# Patient Record
Sex: Male | Born: 1935 | Race: White | Hispanic: No | Marital: Married | Smoking: Former smoker
Health system: Southern US, Community
[De-identification: ages and names within clinical notes are randomized; demographics above are authoritative.]

## PROBLEM LIST (undated history)

## (undated) DIAGNOSIS — M4802 Spinal stenosis, cervical region: Secondary | ICD-10-CM

## (undated) DIAGNOSIS — W57XXXA Bitten or stung by nonvenomous insect and other nonvenomous arthropods, initial encounter: Secondary | ICD-10-CM

## (undated) DIAGNOSIS — R066 Hiccough: Secondary | ICD-10-CM

## (undated) DIAGNOSIS — E876 Hypokalemia: Secondary | ICD-10-CM

## (undated) DIAGNOSIS — E559 Vitamin D deficiency, unspecified: Secondary | ICD-10-CM

## (undated) DIAGNOSIS — M25512 Pain in left shoulder: Secondary | ICD-10-CM

## (undated) DIAGNOSIS — Z860100 Personal history of colon polyps, unspecified: Secondary | ICD-10-CM

## (undated) DIAGNOSIS — R5383 Other fatigue: Secondary | ICD-10-CM

## (undated) DIAGNOSIS — E782 Mixed hyperlipidemia: Secondary | ICD-10-CM

## (undated) DIAGNOSIS — R5381 Other malaise: Secondary | ICD-10-CM

## (undated) DIAGNOSIS — R1312 Dysphagia, oropharyngeal phase: Secondary | ICD-10-CM

## (undated) DIAGNOSIS — Z7409 Other reduced mobility: Secondary | ICD-10-CM

## (undated) DIAGNOSIS — J40 Bronchitis, not specified as acute or chronic: Secondary | ICD-10-CM

## (undated) DIAGNOSIS — B356 Tinea cruris: Secondary | ICD-10-CM

## (undated) DIAGNOSIS — E119 Type 2 diabetes mellitus without complications: Secondary | ICD-10-CM

## (undated) DIAGNOSIS — M25561 Pain in right knee: Secondary | ICD-10-CM

## (undated) DIAGNOSIS — N183 Chronic kidney disease, stage 3 (moderate): Secondary | ICD-10-CM

## (undated) DIAGNOSIS — S1093XA Contusion of unspecified part of neck, initial encounter: Secondary | ICD-10-CM

## (undated) DIAGNOSIS — E785 Hyperlipidemia, unspecified: Secondary | ICD-10-CM

## (undated) DIAGNOSIS — G8929 Other chronic pain: Secondary | ICD-10-CM

## (undated) DIAGNOSIS — K219 Gastro-esophageal reflux disease without esophagitis: Secondary | ICD-10-CM

## (undated) DIAGNOSIS — I10 Essential (primary) hypertension: Secondary | ICD-10-CM

## (undated) DIAGNOSIS — R05 Cough: Secondary | ICD-10-CM

## (undated) DIAGNOSIS — Z981 Arthrodesis status: Secondary | ICD-10-CM

## (undated) DIAGNOSIS — R7989 Other specified abnormal findings of blood chemistry: Secondary | ICD-10-CM

## (undated) DIAGNOSIS — I4891 Unspecified atrial fibrillation: Secondary | ICD-10-CM

## (undated) DIAGNOSIS — I480 Paroxysmal atrial fibrillation: Secondary | ICD-10-CM

## (undated) HISTORY — DX: Cough: R05

## (undated) HISTORY — DX: Type 2 diabetes mellitus without complications: E11.9

## (undated) HISTORY — DX: Spinal stenosis, cervical region: M48.02

## (undated) HISTORY — DX: Hypokalemia: E87.6

## (undated) HISTORY — DX: Other malaise: R53.81

## (undated) HISTORY — DX: Pain in left shoulder: M25.512

## (undated) HISTORY — DX: Personal history of colonic polyps: Z86.010

## (undated) HISTORY — PX: CARPAL TUNNEL RELEASE: SHX101

## (undated) HISTORY — PX: ESOPHAGOGASTRECTOMY: SHX1528

## (undated) HISTORY — DX: Gastro-esophageal reflux disease without esophagitis: K21.9

## (undated) HISTORY — DX: Mixed hyperlipidemia: E78.2

## (undated) HISTORY — DX: Bitten or stung by nonvenomous insect and other nonvenomous arthropods, initial encounter: W57.XXXA

## (undated) HISTORY — DX: Vitamin D deficiency, unspecified: E55.9

## (undated) HISTORY — PX: CERVICAL SPINE SURGERY: SHX589

## (undated) HISTORY — DX: Pain in right knee: M25.561

## (undated) HISTORY — DX: Hiccough: R06.6

## (undated) HISTORY — DX: Paroxysmal atrial fibrillation: I48.0

## (undated) HISTORY — DX: Bronchitis, not specified as acute or chronic: J40

## (undated) HISTORY — DX: Personal history of colon polyps, unspecified: Z86.0100

## (undated) HISTORY — DX: Essential (primary) hypertension: I10

## (undated) HISTORY — DX: Other chronic pain: G89.29

## (undated) HISTORY — DX: Tinea cruris: B35.6

## (undated) HISTORY — DX: Arthrodesis status: Z98.1

## (undated) HISTORY — DX: Contusion of unspecified part of neck, initial encounter: S10.93XA

## (undated) HISTORY — PX: PROSTATE SURGERY: SHX751

## (undated) HISTORY — DX: Other reduced mobility: Z74.09

## (undated) HISTORY — DX: Dysphagia, oropharyngeal phase: R13.12

## (undated) HISTORY — DX: Unspecified atrial fibrillation: I48.91

## (undated) HISTORY — DX: Other specified abnormal findings of blood chemistry: R79.89

## (undated) HISTORY — DX: Chronic kidney disease, stage 3 (moderate): N18.3

## (undated) HISTORY — DX: Other fatigue: R53.83

## (undated) HISTORY — DX: Hyperlipidemia, unspecified: E78.5

---

## 2008-05-23 ENCOUNTER — Ambulatory Visit (HOSPITAL_BASED_OUTPATIENT_CLINIC_OR_DEPARTMENT_OTHER): Admission: RE | Admit: 2008-05-23 | Discharge: 2008-05-23 | Payer: Self-pay | Admitting: Orthopedic Surgery

## 2011-01-14 NOTE — Op Note (Signed)
NAMEARLES, RUMBOLD          ACCOUNT NO.:  [id number]   MEDICAL RECORD NO.:  [id number]          PATIENT TYPE:  AMB   LOCATION:  DSC                          FACILITY:  [facility]   PHYSICIAN:  Katy Fitch. Sypher, M.D. DATE OF BIRTH:  09/16/1926   DATE OF PROCEDURE:  05/23/2008  DATE OF DISCHARGE:                               OPERATIVE REPORT   PREOPERATIVE DIAGNOSES:  1. Entrapped neuropathy, median nerve, left carpal tunnel.  2. Left index finger chronic stenosing tenosynovitis.  3. Left index finger 35-degree flexion contracture.   POSTOPERATIVE DIAGNOSES:  1. Entrapped neuropathy, median nerve, left carpal tunnel.  2. Left index finger chronic stenosing tenosynovitis.  3. Left index finger 35-degree flexion contracture.   OPERATION:  1. Release of left transverse carpal ligament.  2. Release of left index finger A1 pulley and limited synovectomy of      flexor tendons.  3. Injection of left index finger proximal interphalangeal joint with      Depo-Medrol and lidocaine in an effort to help ease flexion      contracture and capsular stiffness.   OPERATING SURGEON:  Katy Fitch. Sypher, MD   ASSISTANT:  Marveen Reeks Dasnoit, PA-C   ANESTHESIA:  General by LMA.   SUPERVISING ANESTHESIOLOGIST:  Burna Forts, MD   INDICATIONS:  Emmette Katt is a 77 year old gentleman referred  through the courtesy of Dr. Sherral Hammers of [place], [place].  He has  a history of hand numbness, stiffness, and triggering.   He has failed nonoperative measures.  Electrodiagnostic studies  confirmed a significant left carpal tunnel syndrome.   Due to failure to respond to nonoperative measures, he is brought to the  operating at this time anticipating left carpal tunnel release, left  index finger A1 pulley release, and left index finger PIP joint  injection.   PROCEDURE:  Calab Sachse was brought to the operating room and  placed in supine position on the operating table.   Following anesthesia screening by Dr. Jacklynn Bue, general anesthesia by  LMA was recommended and accepted by Mr. Anding.   He was brought to room 2 of the [facility], placed in supine  position on the operating table and under Dr. Marlane Mingle direct  supervision, general anesthesia by LMA technique induced.   The left arm was prepped with Betadine soap solution and sterilely  draped.  Perioperative antibiotics were not provided due to the short  duration of the anticipated procedure.   The left arm was exsanguinated with an Esmarch bandage and arterial  tourniquet on the proximal brachium inflated to 220 mmHg.   Procedure commenced with a short incision in the line of the ring finger  in the palm.  Subcutaneous tissues were carefully divided revealing the  palmar fascia.  This split longitudinally to the common sensory branch  of the median nerve.   These were followed back to the transverse carpal ligament, which was  gently isolated from the median nerve with a Insurance risk surveyor.   The transverse carpal ligament was released along its ulnar border with  scissors extending into the distal forearm.  The volar forearm fascia  was likewise released subcutaneously.   This widely opened carpal canal.  The tenosynovium of the ulnar bursa  was quite fibrotic and opaque.  The median nerve appeared to be  compressed beneath the transverse carpal ligament.   The wound was then repaired with intradermal through a Prolene suture.   A Brunner oblique incision was then fashioned over the A1 pulley of the  left index finger.  Subcutaneous tissues were carefully divided taking  care to release the pretendinous fibers of the palmar fascia.  The A1  pulley was isolated.  It was invested with a fibrotic tenosynovium.  This was cleared with a Therapist, nutritional followed by careful release of  the A1 pulley along its radial border.  This was an effort to discourage  ulnar drift of the  finger after release of the A1 pulley.  The flexor  tendons were delivered and found to be sucked down in rather thick  fibrotic tenosynovium.  This was released with scissors.   Thereafter, free passive flexion of the finger was recovered.  There was  still a 35-degree flexion contracture of the PIP joint.  The PIP joint  capsule was then infiltrated with a 50:50 mixture of Depo-Medrol 40  mg/mL 1 mL and 1 mL of 2% lidocaine.   Total volume of 1.4 mL was delivered.  This was injected into the joint  capsule followed by range of motion, followed by gentle injection  beneath the collateral ligaments radially and ulnarly.   Mr. Grainger tolerated the surgery and anesthesia well.  His wounds  were repaired with intradermal through a Prolene as described followed  by dressing with sterile gauze, sterile Webril, and a volar plaster  splint.  There were no apparent complications.   For aftercare, Mr. Desai was provided prescription for Percocet 5  mg 1 p.o. q.4-6 h. p.r.n. pain.  We will see him back for followup in  our office for dressing change and initiation of therapy program in 1  week.       Katy Fitch Sypher, M.D.  Electronically Signed     RVS/MEDQ  D:  05/23/2008  T:  05/23/2008  Job:  [id number]   cc:   Dr. Sherral Hammers

## 2011-06-02 LAB — BASIC METABOLIC PANEL
BUN: 22
CO2: 27
Calcium: 9.2
Glucose, Bld: 105 — ABNORMAL HIGH
Potassium: 4.5
Sodium: 138

## 2012-04-22 DIAGNOSIS — M999 Biomechanical lesion, unspecified: Secondary | ICD-10-CM | POA: Diagnosis not present

## 2012-04-22 DIAGNOSIS — M5126 Other intervertebral disc displacement, lumbar region: Secondary | ICD-10-CM | POA: Diagnosis not present

## 2012-04-29 DIAGNOSIS — M999 Biomechanical lesion, unspecified: Secondary | ICD-10-CM | POA: Diagnosis not present

## 2012-04-29 DIAGNOSIS — M5126 Other intervertebral disc displacement, lumbar region: Secondary | ICD-10-CM | POA: Diagnosis not present

## 2012-05-20 DIAGNOSIS — L219 Seborrheic dermatitis, unspecified: Secondary | ICD-10-CM | POA: Diagnosis not present

## 2012-05-20 DIAGNOSIS — L538 Other specified erythematous conditions: Secondary | ICD-10-CM | POA: Diagnosis not present

## 2012-05-20 DIAGNOSIS — Z23 Encounter for immunization: Secondary | ICD-10-CM | POA: Diagnosis not present

## 2012-05-20 DIAGNOSIS — L821 Other seborrheic keratosis: Secondary | ICD-10-CM | POA: Diagnosis not present

## 2012-07-05 DIAGNOSIS — M25549 Pain in joints of unspecified hand: Secondary | ICD-10-CM | POA: Diagnosis not present

## 2012-07-05 DIAGNOSIS — M6530 Trigger finger, unspecified finger: Secondary | ICD-10-CM | POA: Diagnosis not present

## 2012-08-02 DIAGNOSIS — M6530 Trigger finger, unspecified finger: Secondary | ICD-10-CM | POA: Diagnosis not present

## 2012-08-11 DIAGNOSIS — M25569 Pain in unspecified knee: Secondary | ICD-10-CM | POA: Diagnosis not present

## 2012-08-11 DIAGNOSIS — M1710 Unilateral primary osteoarthritis, unspecified knee: Secondary | ICD-10-CM | POA: Diagnosis not present

## 2012-08-15 DIAGNOSIS — R1084 Generalized abdominal pain: Secondary | ICD-10-CM | POA: Diagnosis not present

## 2012-08-17 DIAGNOSIS — R1011 Right upper quadrant pain: Secondary | ICD-10-CM | POA: Diagnosis not present

## 2012-08-17 DIAGNOSIS — R112 Nausea with vomiting, unspecified: Secondary | ICD-10-CM | POA: Diagnosis not present

## 2012-08-18 DIAGNOSIS — R066 Hiccough: Secondary | ICD-10-CM | POA: Diagnosis not present

## 2012-09-22 DIAGNOSIS — K219 Gastro-esophageal reflux disease without esophagitis: Secondary | ICD-10-CM | POA: Diagnosis not present

## 2012-09-22 DIAGNOSIS — K5901 Slow transit constipation: Secondary | ICD-10-CM | POA: Diagnosis not present

## 2012-10-06 DIAGNOSIS — Z125 Encounter for screening for malignant neoplasm of prostate: Secondary | ICD-10-CM | POA: Diagnosis not present

## 2012-10-06 DIAGNOSIS — N401 Enlarged prostate with lower urinary tract symptoms: Secondary | ICD-10-CM | POA: Diagnosis not present

## 2012-10-06 DIAGNOSIS — Q619 Cystic kidney disease, unspecified: Secondary | ICD-10-CM | POA: Diagnosis not present

## 2012-10-19 DIAGNOSIS — Q619 Cystic kidney disease, unspecified: Secondary | ICD-10-CM | POA: Diagnosis not present

## 2012-10-19 DIAGNOSIS — I700 Atherosclerosis of aorta: Secondary | ICD-10-CM | POA: Diagnosis not present

## 2012-10-21 DIAGNOSIS — N401 Enlarged prostate with lower urinary tract symptoms: Secondary | ICD-10-CM | POA: Diagnosis not present

## 2012-10-21 DIAGNOSIS — N281 Cyst of kidney, acquired: Secondary | ICD-10-CM | POA: Diagnosis not present

## 2013-01-01 DIAGNOSIS — J189 Pneumonia, unspecified organism: Secondary | ICD-10-CM | POA: Diagnosis not present

## 2013-01-01 DIAGNOSIS — J0100 Acute maxillary sinusitis, unspecified: Secondary | ICD-10-CM | POA: Diagnosis not present

## 2013-02-01 DIAGNOSIS — H2510 Age-related nuclear cataract, unspecified eye: Secondary | ICD-10-CM | POA: Diagnosis not present

## 2013-02-22 DIAGNOSIS — L301 Dyshidrosis [pompholyx]: Secondary | ICD-10-CM | POA: Diagnosis not present

## 2013-02-22 DIAGNOSIS — L981 Factitial dermatitis: Secondary | ICD-10-CM | POA: Diagnosis not present

## 2013-02-25 DIAGNOSIS — Z125 Encounter for screening for malignant neoplasm of prostate: Secondary | ICD-10-CM | POA: Diagnosis not present

## 2013-02-25 DIAGNOSIS — Z79899 Other long term (current) drug therapy: Secondary | ICD-10-CM | POA: Diagnosis not present

## 2013-02-25 DIAGNOSIS — E782 Mixed hyperlipidemia: Secondary | ICD-10-CM | POA: Diagnosis not present

## 2013-02-25 DIAGNOSIS — I10 Essential (primary) hypertension: Secondary | ICD-10-CM | POA: Diagnosis not present

## 2013-02-25 DIAGNOSIS — Z0000 Encounter for general adult medical examination without abnormal findings: Secondary | ICD-10-CM | POA: Diagnosis not present

## 2013-03-24 DIAGNOSIS — M546 Pain in thoracic spine: Secondary | ICD-10-CM | POA: Diagnosis not present

## 2013-03-24 DIAGNOSIS — L259 Unspecified contact dermatitis, unspecified cause: Secondary | ICD-10-CM | POA: Diagnosis not present

## 2013-04-12 DIAGNOSIS — L301 Dyshidrosis [pompholyx]: Secondary | ICD-10-CM | POA: Diagnosis not present

## 2013-04-12 DIAGNOSIS — Z006 Encounter for examination for normal comparison and control in clinical research program: Secondary | ICD-10-CM | POA: Diagnosis not present

## 2013-04-12 DIAGNOSIS — I10 Essential (primary) hypertension: Secondary | ICD-10-CM | POA: Diagnosis not present

## 2013-05-09 DIAGNOSIS — L981 Factitial dermatitis: Secondary | ICD-10-CM | POA: Diagnosis not present

## 2013-05-09 DIAGNOSIS — L301 Dyshidrosis [pompholyx]: Secondary | ICD-10-CM | POA: Diagnosis not present

## 2013-05-12 DIAGNOSIS — Z23 Encounter for immunization: Secondary | ICD-10-CM | POA: Diagnosis not present

## 2013-05-30 DIAGNOSIS — L301 Dyshidrosis [pompholyx]: Secondary | ICD-10-CM | POA: Diagnosis not present

## 2013-06-03 DIAGNOSIS — S335XXA Sprain of ligaments of lumbar spine, initial encounter: Secondary | ICD-10-CM | POA: Diagnosis not present

## 2013-06-03 DIAGNOSIS — M62838 Other muscle spasm: Secondary | ICD-10-CM | POA: Diagnosis not present

## 2013-06-14 DIAGNOSIS — R066 Hiccough: Secondary | ICD-10-CM | POA: Diagnosis not present

## 2013-06-14 DIAGNOSIS — K219 Gastro-esophageal reflux disease without esophagitis: Secondary | ICD-10-CM | POA: Diagnosis not present

## 2013-07-04 DIAGNOSIS — Q391 Atresia of esophagus with tracheo-esophageal fistula: Secondary | ICD-10-CM | POA: Diagnosis not present

## 2013-07-04 DIAGNOSIS — K219 Gastro-esophageal reflux disease without esophagitis: Secondary | ICD-10-CM | POA: Diagnosis not present

## 2013-07-04 DIAGNOSIS — K449 Diaphragmatic hernia without obstruction or gangrene: Secondary | ICD-10-CM | POA: Diagnosis not present

## 2013-07-04 DIAGNOSIS — R066 Hiccough: Secondary | ICD-10-CM | POA: Diagnosis not present

## 2013-07-04 DIAGNOSIS — K2289 Other specified disease of esophagus: Secondary | ICD-10-CM | POA: Diagnosis not present

## 2013-07-04 DIAGNOSIS — K2970 Gastritis, unspecified, without bleeding: Secondary | ICD-10-CM | POA: Diagnosis not present

## 2013-09-27 DIAGNOSIS — J019 Acute sinusitis, unspecified: Secondary | ICD-10-CM | POA: Diagnosis not present

## 2013-09-27 DIAGNOSIS — H6120 Impacted cerumen, unspecified ear: Secondary | ICD-10-CM | POA: Diagnosis not present

## 2013-10-01 DIAGNOSIS — H269 Unspecified cataract: Secondary | ICD-10-CM | POA: Diagnosis not present

## 2013-11-10 DIAGNOSIS — J209 Acute bronchitis, unspecified: Secondary | ICD-10-CM | POA: Diagnosis not present

## 2013-11-10 DIAGNOSIS — J018 Other acute sinusitis: Secondary | ICD-10-CM | POA: Diagnosis not present

## 2014-02-06 DIAGNOSIS — R609 Edema, unspecified: Secondary | ICD-10-CM | POA: Diagnosis not present

## 2014-05-15 DIAGNOSIS — A088 Other specified intestinal infections: Secondary | ICD-10-CM | POA: Diagnosis not present

## 2014-06-14 DIAGNOSIS — Z789 Other specified health status: Secondary | ICD-10-CM | POA: Diagnosis not present

## 2014-06-14 DIAGNOSIS — R609 Edema, unspecified: Secondary | ICD-10-CM | POA: Diagnosis not present

## 2014-06-14 DIAGNOSIS — I10 Essential (primary) hypertension: Secondary | ICD-10-CM | POA: Diagnosis not present

## 2014-06-15 DIAGNOSIS — Z23 Encounter for immunization: Secondary | ICD-10-CM | POA: Diagnosis not present

## 2014-06-28 DIAGNOSIS — R609 Edema, unspecified: Secondary | ICD-10-CM | POA: Diagnosis not present

## 2014-06-28 DIAGNOSIS — I700 Atherosclerosis of aorta: Secondary | ICD-10-CM | POA: Diagnosis not present

## 2014-07-17 DIAGNOSIS — R609 Edema, unspecified: Secondary | ICD-10-CM | POA: Diagnosis not present

## 2014-10-26 DIAGNOSIS — L820 Inflamed seborrheic keratosis: Secondary | ICD-10-CM | POA: Diagnosis not present

## 2014-11-02 DIAGNOSIS — M7702 Medial epicondylitis, left elbow: Secondary | ICD-10-CM | POA: Diagnosis not present

## 2014-12-27 DIAGNOSIS — M79602 Pain in left arm: Secondary | ICD-10-CM | POA: Diagnosis not present

## 2014-12-27 DIAGNOSIS — R2241 Localized swelling, mass and lump, right lower limb: Secondary | ICD-10-CM | POA: Diagnosis not present

## 2014-12-27 DIAGNOSIS — R5383 Other fatigue: Secondary | ICD-10-CM | POA: Diagnosis not present

## 2015-01-17 DIAGNOSIS — Z9181 History of falling: Secondary | ICD-10-CM | POA: Diagnosis not present

## 2015-01-17 DIAGNOSIS — Z0000 Encounter for general adult medical examination without abnormal findings: Secondary | ICD-10-CM | POA: Diagnosis not present

## 2015-01-17 DIAGNOSIS — B354 Tinea corporis: Secondary | ICD-10-CM | POA: Diagnosis not present

## 2015-01-17 DIAGNOSIS — I10 Essential (primary) hypertension: Secondary | ICD-10-CM | POA: Diagnosis not present

## 2015-01-17 DIAGNOSIS — M25521 Pain in right elbow: Secondary | ICD-10-CM | POA: Diagnosis not present

## 2015-01-17 DIAGNOSIS — R7309 Other abnormal glucose: Secondary | ICD-10-CM | POA: Diagnosis not present

## 2015-01-17 DIAGNOSIS — E782 Mixed hyperlipidemia: Secondary | ICD-10-CM | POA: Diagnosis not present

## 2015-05-28 DIAGNOSIS — H6123 Impacted cerumen, bilateral: Secondary | ICD-10-CM | POA: Diagnosis not present

## 2015-05-28 DIAGNOSIS — R42 Dizziness and giddiness: Secondary | ICD-10-CM | POA: Diagnosis not present

## 2015-05-28 DIAGNOSIS — R7309 Other abnormal glucose: Secondary | ICD-10-CM | POA: Diagnosis not present

## 2015-05-28 DIAGNOSIS — Z1389 Encounter for screening for other disorder: Secondary | ICD-10-CM | POA: Diagnosis not present

## 2015-06-29 DIAGNOSIS — Z23 Encounter for immunization: Secondary | ICD-10-CM | POA: Diagnosis not present

## 2015-07-06 DIAGNOSIS — H25813 Combined forms of age-related cataract, bilateral: Secondary | ICD-10-CM | POA: Diagnosis not present

## 2015-07-17 DIAGNOSIS — S0990XA Unspecified injury of head, initial encounter: Secondary | ICD-10-CM | POA: Diagnosis not present

## 2015-07-17 DIAGNOSIS — T148 Other injury of unspecified body region: Secondary | ICD-10-CM | POA: Diagnosis not present

## 2015-07-17 DIAGNOSIS — M1611 Unilateral primary osteoarthritis, right hip: Secondary | ICD-10-CM | POA: Diagnosis not present

## 2015-07-17 DIAGNOSIS — S6991XA Unspecified injury of right wrist, hand and finger(s), initial encounter: Secondary | ICD-10-CM | POA: Diagnosis not present

## 2015-07-17 DIAGNOSIS — S060X9A Concussion with loss of consciousness of unspecified duration, initial encounter: Secondary | ICD-10-CM | POA: Diagnosis not present

## 2015-07-17 DIAGNOSIS — R51 Headache: Secondary | ICD-10-CM | POA: Diagnosis not present

## 2015-07-17 DIAGNOSIS — S20219A Contusion of unspecified front wall of thorax, initial encounter: Secondary | ICD-10-CM | POA: Diagnosis not present

## 2015-07-17 DIAGNOSIS — R0602 Shortness of breath: Secondary | ICD-10-CM | POA: Diagnosis not present

## 2015-10-02 DIAGNOSIS — R51 Headache: Secondary | ICD-10-CM | POA: Diagnosis not present

## 2015-10-02 DIAGNOSIS — R7309 Other abnormal glucose: Secondary | ICD-10-CM | POA: Diagnosis not present

## 2015-10-02 DIAGNOSIS — J0110 Acute frontal sinusitis, unspecified: Secondary | ICD-10-CM | POA: Diagnosis not present

## 2015-10-02 DIAGNOSIS — Z1389 Encounter for screening for other disorder: Secondary | ICD-10-CM | POA: Diagnosis not present

## 2015-10-31 DIAGNOSIS — L821 Other seborrheic keratosis: Secondary | ICD-10-CM | POA: Diagnosis not present

## 2015-10-31 DIAGNOSIS — L219 Seborrheic dermatitis, unspecified: Secondary | ICD-10-CM | POA: Diagnosis not present

## 2016-04-22 DIAGNOSIS — K219 Gastro-esophageal reflux disease without esophagitis: Secondary | ICD-10-CM | POA: Diagnosis not present

## 2016-04-22 DIAGNOSIS — R1314 Dysphagia, pharyngoesophageal phase: Secondary | ICD-10-CM | POA: Diagnosis not present

## 2016-04-24 DIAGNOSIS — K228 Other specified diseases of esophagus: Secondary | ICD-10-CM | POA: Diagnosis not present

## 2016-04-24 DIAGNOSIS — K449 Diaphragmatic hernia without obstruction or gangrene: Secondary | ICD-10-CM | POA: Diagnosis not present

## 2016-04-24 DIAGNOSIS — R1310 Dysphagia, unspecified: Secondary | ICD-10-CM | POA: Diagnosis not present

## 2016-04-25 ENCOUNTER — Other Ambulatory Visit: Payer: Self-pay

## 2016-04-28 DIAGNOSIS — K449 Diaphragmatic hernia without obstruction or gangrene: Secondary | ICD-10-CM | POA: Diagnosis not present

## 2016-04-28 DIAGNOSIS — Z86010 Personal history of colonic polyps: Secondary | ICD-10-CM | POA: Diagnosis not present

## 2016-04-28 DIAGNOSIS — K222 Esophageal obstruction: Secondary | ICD-10-CM | POA: Diagnosis not present

## 2016-04-28 DIAGNOSIS — I10 Essential (primary) hypertension: Secondary | ICD-10-CM | POA: Diagnosis not present

## 2016-04-28 DIAGNOSIS — E7800 Pure hypercholesterolemia, unspecified: Secondary | ICD-10-CM | POA: Diagnosis not present

## 2016-04-28 DIAGNOSIS — K319 Disease of stomach and duodenum, unspecified: Secondary | ICD-10-CM | POA: Diagnosis not present

## 2016-04-28 DIAGNOSIS — K219 Gastro-esophageal reflux disease without esophagitis: Secondary | ICD-10-CM | POA: Diagnosis not present

## 2016-04-28 DIAGNOSIS — K228 Other specified diseases of esophagus: Secondary | ICD-10-CM | POA: Diagnosis not present

## 2016-04-28 DIAGNOSIS — Z7984 Long term (current) use of oral hypoglycemic drugs: Secondary | ICD-10-CM | POA: Diagnosis not present

## 2016-04-28 DIAGNOSIS — K2950 Unspecified chronic gastritis without bleeding: Secondary | ICD-10-CM | POA: Diagnosis not present

## 2016-04-28 DIAGNOSIS — Q396 Congenital diverticulum of esophagus: Secondary | ICD-10-CM | POA: Diagnosis not present

## 2016-04-28 DIAGNOSIS — R1310 Dysphagia, unspecified: Secondary | ICD-10-CM | POA: Diagnosis not present

## 2016-04-28 DIAGNOSIS — Q393 Congenital stenosis and stricture of esophagus: Secondary | ICD-10-CM | POA: Diagnosis not present

## 2016-04-28 DIAGNOSIS — Z79899 Other long term (current) drug therapy: Secondary | ICD-10-CM | POA: Diagnosis not present

## 2016-04-28 DIAGNOSIS — K253 Acute gastric ulcer without hemorrhage or perforation: Secondary | ICD-10-CM | POA: Diagnosis not present

## 2016-04-30 DIAGNOSIS — J40 Bronchitis, not specified as acute or chronic: Secondary | ICD-10-CM | POA: Diagnosis not present

## 2016-06-05 DIAGNOSIS — Z23 Encounter for immunization: Secondary | ICD-10-CM | POA: Diagnosis not present

## 2016-07-11 DIAGNOSIS — H25813 Combined forms of age-related cataract, bilateral: Secondary | ICD-10-CM | POA: Diagnosis not present

## 2016-10-25 DIAGNOSIS — M545 Low back pain: Secondary | ICD-10-CM | POA: Diagnosis not present

## 2016-11-11 DIAGNOSIS — J329 Chronic sinusitis, unspecified: Secondary | ICD-10-CM | POA: Diagnosis not present

## 2016-11-11 DIAGNOSIS — M546 Pain in thoracic spine: Secondary | ICD-10-CM | POA: Diagnosis not present

## 2016-11-11 DIAGNOSIS — J40 Bronchitis, not specified as acute or chronic: Secondary | ICD-10-CM | POA: Diagnosis not present

## 2016-12-26 DIAGNOSIS — L821 Other seborrheic keratosis: Secondary | ICD-10-CM | POA: Diagnosis not present

## 2016-12-26 DIAGNOSIS — L219 Seborrheic dermatitis, unspecified: Secondary | ICD-10-CM | POA: Diagnosis not present

## 2016-12-26 DIAGNOSIS — L570 Actinic keratosis: Secondary | ICD-10-CM | POA: Diagnosis not present

## 2017-02-04 DIAGNOSIS — I119 Hypertensive heart disease without heart failure: Secondary | ICD-10-CM

## 2017-02-04 DIAGNOSIS — I10 Essential (primary) hypertension: Secondary | ICD-10-CM

## 2017-02-04 DIAGNOSIS — H9202 Otalgia, left ear: Secondary | ICD-10-CM | POA: Diagnosis not present

## 2017-02-04 DIAGNOSIS — M542 Cervicalgia: Secondary | ICD-10-CM | POA: Diagnosis not present

## 2017-02-04 DIAGNOSIS — E782 Mixed hyperlipidemia: Secondary | ICD-10-CM

## 2017-02-04 DIAGNOSIS — M26622 Arthralgia of left temporomandibular joint: Secondary | ICD-10-CM | POA: Diagnosis not present

## 2017-02-04 HISTORY — DX: Mixed hyperlipidemia: E78.2

## 2017-02-04 HISTORY — DX: Hypertensive heart disease without heart failure: I11.9

## 2017-02-04 HISTORY — DX: Essential (primary) hypertension: I10

## 2017-04-22 DIAGNOSIS — W57XXXA Bitten or stung by nonvenomous insect and other nonvenomous arthropods, initial encounter: Secondary | ICD-10-CM

## 2017-04-22 DIAGNOSIS — S30861A Insect bite (nonvenomous) of abdominal wall, initial encounter: Secondary | ICD-10-CM | POA: Diagnosis not present

## 2017-04-22 HISTORY — DX: Bitten or stung by nonvenomous insect and other nonvenomous arthropods, initial encounter: W57.XXXA

## 2017-06-03 DIAGNOSIS — Z23 Encounter for immunization: Secondary | ICD-10-CM | POA: Diagnosis not present

## 2017-06-10 DIAGNOSIS — L570 Actinic keratosis: Secondary | ICD-10-CM | POA: Diagnosis not present

## 2017-06-10 DIAGNOSIS — L578 Other skin changes due to chronic exposure to nonionizing radiation: Secondary | ICD-10-CM | POA: Diagnosis not present

## 2017-06-10 DIAGNOSIS — L821 Other seborrheic keratosis: Secondary | ICD-10-CM | POA: Diagnosis not present

## 2017-07-05 DIAGNOSIS — J0100 Acute maxillary sinusitis, unspecified: Secondary | ICD-10-CM | POA: Diagnosis not present

## 2017-07-05 DIAGNOSIS — M19011 Primary osteoarthritis, right shoulder: Secondary | ICD-10-CM | POA: Diagnosis not present

## 2017-07-05 DIAGNOSIS — H6123 Impacted cerumen, bilateral: Secondary | ICD-10-CM | POA: Diagnosis not present

## 2017-08-20 DIAGNOSIS — M50321 Other cervical disc degeneration at C4-C5 level: Secondary | ICD-10-CM | POA: Diagnosis not present

## 2017-08-20 DIAGNOSIS — M5412 Radiculopathy, cervical region: Secondary | ICD-10-CM | POA: Diagnosis not present

## 2017-08-20 DIAGNOSIS — M4802 Spinal stenosis, cervical region: Secondary | ICD-10-CM | POA: Diagnosis not present

## 2017-08-20 DIAGNOSIS — I10 Essential (primary) hypertension: Secondary | ICD-10-CM | POA: Diagnosis not present

## 2017-08-20 DIAGNOSIS — E119 Type 2 diabetes mellitus without complications: Secondary | ICD-10-CM

## 2017-08-20 DIAGNOSIS — B356 Tinea cruris: Secondary | ICD-10-CM

## 2017-08-20 DIAGNOSIS — Z0000 Encounter for general adult medical examination without abnormal findings: Secondary | ICD-10-CM | POA: Diagnosis not present

## 2017-08-20 DIAGNOSIS — E559 Vitamin D deficiency, unspecified: Secondary | ICD-10-CM | POA: Diagnosis not present

## 2017-08-20 DIAGNOSIS — M25561 Pain in right knee: Secondary | ICD-10-CM | POA: Diagnosis not present

## 2017-08-20 DIAGNOSIS — M25461 Effusion, right knee: Secondary | ICD-10-CM | POA: Diagnosis not present

## 2017-08-20 DIAGNOSIS — E782 Mixed hyperlipidemia: Secondary | ICD-10-CM | POA: Diagnosis not present

## 2017-08-20 DIAGNOSIS — R5381 Other malaise: Secondary | ICD-10-CM | POA: Insufficient documentation

## 2017-08-20 DIAGNOSIS — K219 Gastro-esophageal reflux disease without esophagitis: Secondary | ICD-10-CM

## 2017-08-20 DIAGNOSIS — G8929 Other chronic pain: Secondary | ICD-10-CM

## 2017-08-20 DIAGNOSIS — Z79899 Other long term (current) drug therapy: Secondary | ICD-10-CM | POA: Diagnosis not present

## 2017-08-20 DIAGNOSIS — M25512 Pain in left shoulder: Secondary | ICD-10-CM | POA: Diagnosis not present

## 2017-08-20 DIAGNOSIS — R5383 Other fatigue: Secondary | ICD-10-CM | POA: Diagnosis not present

## 2017-08-20 DIAGNOSIS — E1165 Type 2 diabetes mellitus with hyperglycemia: Secondary | ICD-10-CM | POA: Diagnosis not present

## 2017-08-20 HISTORY — DX: Other malaise: R53.81

## 2017-08-20 HISTORY — DX: Tinea cruris: B35.6

## 2017-08-20 HISTORY — DX: Type 2 diabetes mellitus without complications: E11.9

## 2017-08-20 HISTORY — DX: Gastro-esophageal reflux disease without esophagitis: K21.9

## 2017-08-20 HISTORY — DX: Vitamin D deficiency, unspecified: E55.9

## 2017-08-20 HISTORY — DX: Other chronic pain: G89.29

## 2017-08-27 ENCOUNTER — Other Ambulatory Visit: Payer: Self-pay | Admitting: Family Medicine

## 2017-08-27 DIAGNOSIS — M5412 Radiculopathy, cervical region: Secondary | ICD-10-CM

## 2017-09-05 ENCOUNTER — Ambulatory Visit
Admission: RE | Admit: 2017-09-05 | Discharge: 2017-09-05 | Disposition: A | Discharge status: Home / Self Care | Payer: Medicare Other | Source: Ambulatory Visit | Attending: Family Medicine | Admitting: Family Medicine

## 2017-09-05 DIAGNOSIS — M5412 Radiculopathy, cervical region: Secondary | ICD-10-CM

## 2017-10-12 DIAGNOSIS — R058 Other specified cough: Secondary | ICD-10-CM

## 2017-10-12 DIAGNOSIS — R05 Cough: Secondary | ICD-10-CM | POA: Insufficient documentation

## 2017-10-12 HISTORY — DX: Other specified cough: R05.8

## 2017-10-20 DIAGNOSIS — J40 Bronchitis, not specified as acute or chronic: Secondary | ICD-10-CM

## 2017-10-20 HISTORY — DX: Bronchitis, not specified as acute or chronic: J40

## 2017-11-11 DIAGNOSIS — M4802 Spinal stenosis, cervical region: Secondary | ICD-10-CM

## 2017-11-11 HISTORY — DX: Spinal stenosis, cervical region: M48.02

## 2017-11-16 DIAGNOSIS — I4891 Unspecified atrial fibrillation: Secondary | ICD-10-CM

## 2017-11-16 DIAGNOSIS — E785 Hyperlipidemia, unspecified: Secondary | ICD-10-CM

## 2017-11-16 DIAGNOSIS — N183 Chronic kidney disease, stage 3 unspecified: Secondary | ICD-10-CM

## 2017-11-16 DIAGNOSIS — Z981 Arthrodesis status: Secondary | ICD-10-CM

## 2017-11-16 DIAGNOSIS — R7989 Other specified abnormal findings of blood chemistry: Secondary | ICD-10-CM | POA: Insufficient documentation

## 2017-11-16 DIAGNOSIS — E876 Hypokalemia: Secondary | ICD-10-CM

## 2017-11-16 DIAGNOSIS — R778 Other specified abnormalities of plasma proteins: Secondary | ICD-10-CM

## 2017-11-16 HISTORY — DX: Other specified abnormal findings of blood chemistry: R79.89

## 2017-11-16 HISTORY — DX: Other specified abnormalities of plasma proteins: R77.8

## 2017-11-16 HISTORY — DX: Arthrodesis status: Z98.1

## 2017-11-16 HISTORY — DX: Hypokalemia: E87.6

## 2017-11-16 HISTORY — DX: Unspecified atrial fibrillation: I48.91

## 2017-11-16 HISTORY — DX: Chronic kidney disease, stage 3 unspecified: N18.30

## 2017-11-16 HISTORY — DX: Hyperlipidemia, unspecified: E78.5

## 2017-11-19 DIAGNOSIS — S1093XA Contusion of unspecified part of neck, initial encounter: Secondary | ICD-10-CM

## 2017-11-19 HISTORY — DX: Contusion of unspecified part of neck, initial encounter: S10.93XA

## 2017-11-24 DIAGNOSIS — I480 Paroxysmal atrial fibrillation: Secondary | ICD-10-CM

## 2017-11-24 HISTORY — DX: Paroxysmal atrial fibrillation: I48.0

## 2017-12-11 DIAGNOSIS — R1312 Dysphagia, oropharyngeal phase: Secondary | ICD-10-CM | POA: Insufficient documentation

## 2017-12-11 DIAGNOSIS — Z7409 Other reduced mobility: Secondary | ICD-10-CM

## 2017-12-11 HISTORY — DX: Dysphagia, oropharyngeal phase: R13.12

## 2017-12-11 HISTORY — DX: Other reduced mobility: Z74.09

## 2017-12-15 ENCOUNTER — Encounter: Payer: Self-pay | Admitting: Cardiology

## 2017-12-15 ENCOUNTER — Ambulatory Visit (INDEPENDENT_AMBULATORY_CARE_PROVIDER_SITE_OTHER): Payer: Medicare Other | Admitting: Cardiology

## 2017-12-15 VITALS — BP 134/72 | HR 90 | Ht 67.0 in | Wt 192.0 lb

## 2017-12-15 DIAGNOSIS — I119 Hypertensive heart disease without heart failure: Secondary | ICD-10-CM

## 2017-12-15 DIAGNOSIS — R748 Abnormal levels of other serum enzymes: Secondary | ICD-10-CM | POA: Diagnosis not present

## 2017-12-15 DIAGNOSIS — R7989 Other specified abnormal findings of blood chemistry: Secondary | ICD-10-CM

## 2017-12-15 DIAGNOSIS — I480 Paroxysmal atrial fibrillation: Secondary | ICD-10-CM | POA: Diagnosis not present

## 2017-12-15 DIAGNOSIS — I4891 Unspecified atrial fibrillation: Secondary | ICD-10-CM

## 2017-12-15 DIAGNOSIS — R778 Other specified abnormalities of plasma proteins: Secondary | ICD-10-CM

## 2017-12-15 MED ORDER — METOPROLOL SUCCINATE ER 50 MG PO TB24: 50.0000 mg | ORAL_TABLET | Freq: Every day | ORAL | 11 refills | Status: DC

## 2017-12-15 NOTE — Patient Instructions (Signed)
Medication Instructions:  Your physician has recommended you make the following change in your medication:  START metoprolol succinate (Toprol XL) 50 mg daily  You may use Robitussin DM over the counter as needed.  Labwork: Your physician recommends that you have the following labs drawn: CBC, BNP, BMP  Testing/Procedures: You had an EKG today.  Follow-Up: Your physician recommends that you schedule a follow-up appointment in: 6 weeks.  Any Other Special Instructions Will Be Listed Below (If Applicable).     If you need a refill on your cardiac medications before your next appointment, please call your pharmacy.

## 2017-12-15 NOTE — Progress Notes (Signed)
Cardiology Office Note:    Date:  12/15/2017   ID:  Lawrence DonningHerbert L Haugen, DOB [birth year], MRN [id number]  PCP:  Hadley Penobbins, Robert A, MD  Cardiologist:  Norman HerrlichBrian Munley, MD   Referring MD: Hadley Penobbins, Robert A, MD  ASSESSMENT:    1. Atrial fibrillation with rapid ventricular response (HCC)   2. Paroxysmal atrial fibrillation (HCC)   3. Hypertensive heart disease without heart failure   4. Elevated troponin    PLAN:    In order of problems listed above:  1. Stable he has had no recurrence symptomatically since leaving [facility] but in some fashion was not given prescription for beta-blocker, I have him resume at this time we will not put on a regular antiplatelet or anticoagulant.  He will return to my office on Monday for blood pressure check an EKG on beta-blocker.  If he has clinical recurrence of placement antiarrhythmic drug. 2. See above 3. Stable continue current treatment ACE diuretic recheck renal function as well as CBC is mildly anemic leaving the hospital 4. Secondary to rapid atrial fibrillation or demand ischemia, this time I do not think he requires an ischemia evaluation although we will explore the issue at his next visit.  Next appointment 6 weeks   Medication Adjustments/Labs and Tests Ordered: Current medicines are reviewed at length with the patient today.  Concerns regarding medicines are outlined above.  No orders of the defined types were placed in this encounter.  No orders of the defined types were placed in this encounter.    Chief Complaint  Patient presents with  . Hospitalization Follow-up  . Atrial Fibrillation  . Hypertension    History of Present Illness:    Lawrence Myers is a 84 y.o. male who is being seen today for the evaluation of PAF at the request of Hadley Penobbins, Robert A, MD. He  had PAF with rapid rate and elevated troponin I peak 0.44 [facility] felt to represent type 2 MI demand ischemia.  Spent greater than 31  minutes exploring extensive records prior to this visit.  Since discharge from skilled nursing he feels improved but is still weak has a cough when he swallows and he discussed this with his neurosurgeon and slowly his appetite is improved.  He is capable of taking care of himself and has had no shortness of breath chest pain palpitation or syncope.  He has not anticoagulated is not taking antiarrhythmic drug in some fashion his beta-blocker was not continued at discharge from the hospital.  11/18/17: [facility]  Principal Problem: Hematoma of neck, post operative Active Problems: Impaired functional mobility, balance, gait, and endurance Oropharyngeal dysphagia Paroxysmal atrial fibrillation (HCC) LOS: 7 days  Primary Cardiologist: Dr. Dulce SellarMunley Assessment/Plan:  84 yo M w a h/o cervical radiculopathy s/p C3-C5 anterior cervical decompression fusion was admitted 11/19/17 with a postoperative cervical hematoma after surgical repair 11/11/17 in the setting of new anticoagulation for atrial fibrillation the day prior. He was seen [facility] & transferred for further management s/p evacuation the same day. Other relevant medical history includes DM, HTN, & HLD. He converted to NSR with Diltiazem, later transitioned to Metoprolol. He had recurrent AF with RVR the afternoon of 10/31/17, & his Metoprolol was increased from 50 mg BID to 75 mg BID.  1. Paroxysmal atrial fibrillation - Currently NSR on telemetry. 2. Hematoma of cervical spine after anticoagulation, s/p evacuation 11/19/17 3. Respiratory failure - Resolved, now on RA 4.  Dysphagia related to surgery - Improving, now able to teat 5. Hypertension - Elevated 6. Dylsipidemia 7. Leukocytosis - Improving - Not a candidate for anticoagulation. - Agree with hs home Atorvastatin, Metoprolol 75 mg BID, & Lisinopril 5 - can likely be up-titrated back to his home Lisinopril 20-HCTZ 12.5 at home in the near  future.   11/17/17 TTE: Findings  Left Ventricle: The left ventricular chamber size is normal. There is no left ventricular hypertrophy. Global left ventricular wall motion and contractility are within normal limits. There is normal left ventricular systolic function. The estimated ejection fraction is 50-55%., mild LAE Moderate to severe MR 11/23/17 TTE Findings.Summary Structurally normal mitral valve with good mobility Mild to Moderate mitral regurgitation. . The left ventricle was not well visualized. Grossly normal left ventricular function. Ejection fraction is visually estimated at 55-60%   Past Medical History:  Diagnosis Date  . Atrial fibrillation with rapid ventricular response (HCC) 11/16/2017   Last Assessment & Plan:  Remains in sinus rhythm but his heart rate is around 100. Will add metoprolol 25 mg q.6 hours.  Continue to monitor.  As noted above recommend starting anticoagulant therapy when okay from a surgical stand point  . Bronchitis 10/20/2017  . Cervical stenosis of spinal canal 11/11/2017  . Chronic left shoulder pain 08/20/2017  . Chronic pain of right knee 08/20/2017  . Elevated troponin I level 11/16/2017   Last Assessment & Plan:  -given the lack of chest discomfort, this most likely represents type 2 NSTEMI, demand ischemia, in the setting of rapid atrial fibrillation -echocardiogram shows EF of 50-55% -no further workup  . Essential hypertension 02/04/2017   Last Assessment & Plan:  -home regimen includes lisinopril-hydrochlorothiazide -will hold this in the setting of elevated creatinine -is on metoprolol at this time in the setting of AFib, will continue -continue monitor blood pressure and make adjustments as necessary  . GERD without esophagitis 08/20/2017  . Hematoma of neck 11/19/2017  . Hyperlipidemia 11/16/2017   Last Assessment & Plan:  -continue simvastatin  . Hypokalemia 11/16/2017   Last Assessment & Plan:  Replete with IV fluids  . Impaired  functional mobility, balance, gait, and endurance 12/11/2017  . Malaise and fatigue 08/20/2017  . Mixed dyslipidemia 02/04/2017  . Oropharyngeal dysphagia 12/11/2017  . Paroxysmal atrial fibrillation (HCC) 11/24/2017  . Productive cough 10/12/2017  . Stage 3 chronic kidney disease (HCC) 11/16/2017   Last Assessment & Plan:  Review of care everywhere shows previous creatinines over the last 6 months between 1.4 and 1.6.  Gently hydrating repleting potassium  . Status post cervical spinal fusion 11/16/2017   Last Assessment & Plan:  -underwent this March 13th in [place]) -complicated by moderate hematoma left anterior neck -has mild sore throat but able to swallow and currently without respiratory distress -however patient does report that he feels his hematoma is slightly enlarged today -he had been started on anticoagulation in the setting of AFIB and thromboembolic risk  Plan...  . Tick bite 04/22/2017  . Tinea cruris 08/20/2017  . Type 2 diabetes mellitus without complication, without long-term current use of insulin (HCC) 08/20/2017   Last Assessment & Plan:  -currently on metformin; holding during admission -POC glucose -SSI -diabetic diet  . Vitamin D deficiency 08/20/2017    Past Surgical History:  Procedure Laterality Date  . CARPAL TUNNEL RELEASE    . CERVICAL SPINE SURGERY    . ESOPHAGOGASTRECTOMY    . PROSTATE SURGERY  Current Medications: Current Meds  Medication Sig  . lisinopril-hydrochlorothiazide (PRINZIDE,ZESTORETIC) 20-12.5 MG tablet Take 1 tablet by mouth daily.  . metFORMIN (GLUCOPHAGE) 500 MG tablet Take 1 tablet by mouth 2 (two) times daily.  . simvastatin (ZOCOR) 40 MG tablet TAKE 1 TABLET EVERY DAY     Allergies:   Patient has no known allergies.   Social History   Socioeconomic History  . Marital status: Married    Spouse name: Not on file  . Number of children: Not on file  . Years of education: Not on file  . Highest education level:  Not on file  Occupational History  . Not on file  Social Needs  . Financial resource strain: Not on file  . Food insecurity:    Worry: Not on file    Inability: Not on file  . Transportation needs:    Medical: Not on file    Non-medical: Not on file  Tobacco Use  . Smoking status: Former Games developer  . Smokeless tobacco: Never Used  Substance and Sexual Activity  . Alcohol use: Not Currently  . Drug use: Not Currently  . Sexual activity: Not on file  Lifestyle  . Physical activity:    Days per week: Not on file    Minutes per session: Not on file  . Stress: Not on file  Relationships  . Social connections:    Talks on phone: Not on file    Gets together: Not on file    Attends religious service: Not on file    Active member of club or organization: Not on file    Attends meetings of clubs or organizations: Not on file    Relationship status: Not on file  Other Topics Concern  . Not on file  Social History Narrative  . Not on file     Family History: The patient's family history includes Arthritis in his father and mother; Heart disease in his mother.  ROS:   Review of Systems  Constitution: Positive for weight loss.  HENT: Negative.   Eyes: Negative.   Cardiovascular: Negative for chest pain, dyspnea on exertion, palpitations and syncope.  Respiratory: Positive for cough (with eatingwith swallowing).   Endocrine: Negative.   Hematologic/Lymphatic: Negative.   Skin: Negative.   Musculoskeletal: Positive for muscle weakness.  Gastrointestinal: Positive for dysphagia.  Genitourinary: Negative.   Neurological: Negative.   Psychiatric/Behavioral: Negative.    Please see the history of present illness.     All other systems reviewed and are negative.  EKGs/Labs/Other Studies Reviewed:    The following studies were reviewed today:   EKG:  EKG is sinus rhythm within normal limits ordered today.  T  Recent Labs: 11/25/17 K 3.9 TSH BMP normal Hgb 10.8  CARDIAC  PROTEINS Component Name Troponin I 11/17/2017 11/17/2017 11/16/2017   0.45 (H) 0.44 (H) 0.27 (HH)      No results found for requested labs within last 8760 hours.  Recent Lipid Panel Chol 108, HDL 29 LDL 66 No results found for: CHOL, TRIG, HDL, CHOLHDL, VLDL, LDLCALC, LDLDIRECT  Physical Exam:    VS:  BP 134/72 (BP Location: Right Arm, Patient Position: Sitting, Cuff Size: Normal)   Pulse 90   Ht 5\' 7"  (1.702 m)   Wt 192 lb (87.1 kg)   SpO2 97%   BMI 30.07 kg/m     Wt Readings from Last 3 Encounters:  12/15/17 192 lb (87.1 kg)     GEN:  Well nourished, well developed in no  acute distress HEENT: Normal NECK: No JVD; No carotid bruits LYMPHATICS: No lymphadenopathy CARDIAC: RRR, no murmurs, rubs, gallops RESPIRATORY:  Clear to auscultation without rales, wheezing or rhonchi  ABDOMEN: Soft, non-tender, non-distended MUSCULOSKELETAL:  1+ billateral edema; No deformity  SKIN: Warm and dry NEUROLOGIC:  Alert and oriented x 3 PSYCHIATRIC:  Normal affect     Signed, Norman Herrlich, MD  12/15/2017 2:13 PM    [facility]

## 2017-12-16 ENCOUNTER — Ambulatory Visit: Payer: Medicare Other | Admitting: Cardiology

## 2017-12-16 LAB — CBC
HEMATOCRIT: 36.5 % — AB (ref 37.5–51.0)
HEMOGLOBIN: 11.9 g/dL — AB (ref 13.0–17.7)
MCH: 29.6 pg (ref 26.6–33.0)
MCHC: 32.6 g/dL (ref 31.5–35.7)
MCV: 91 fL (ref 79–97)
Platelets: 178 10*3/uL (ref 150–379)
RBC: 4.02 x10E6/uL — AB (ref 4.14–5.80)
RDW: 13.5 % (ref 12.3–15.4)
WBC: 11.4 10*3/uL — ABNORMAL HIGH (ref 3.4–10.8)

## 2017-12-16 LAB — BASIC METABOLIC PANEL
BUN / CREAT RATIO: 19 (ref 10–24)
BUN: 21 mg/dL (ref 8–27)
CO2: 25 mmol/L (ref 20–29)
CREATININE: 1.13 mg/dL (ref 0.76–1.27)
Calcium: 10.2 mg/dL (ref 8.6–10.2)
Chloride: 98 mmol/L (ref 96–106)
GFR, EST AFRICAN AMERICAN: 66 mL/min/{1.73_m2} (ref >59)
GFR, EST NON AFRICAN AMERICAN: 57 mL/min/{1.73_m2} — AB (ref >59)
Glucose: 112 mg/dL — ABNORMAL HIGH (ref 65–99)
Potassium: 4.2 mmol/L (ref 3.5–5.2)
Sodium: 139 mmol/L (ref 134–144)

## 2017-12-16 LAB — PRO B NATRIURETIC PEPTIDE: NT-PRO BNP: 529 pg/mL — AB (ref 0–486)

## 2017-12-21 ENCOUNTER — Ambulatory Visit: Payer: Medicare Other

## 2018-01-26 NOTE — Progress Notes (Signed)
Cardiology Office Note:    Date:  01/27/2018   ID:  Lawrence Myers, DOB 1927/04/29, MRN [id number]  PCP:  Hadley Pen, MD  Cardiologist:  Norman Herrlich, MD    Referring MD: Hadley Pen, MD    ASSESSMENT:    1. Paroxysmal atrial fibrillation (HCC)   2. Hypertensive heart disease without heart failure   3. Mixed hyperlipidemia    PLAN:    In order of problems listed above:  1. Stable, on the 10th he had a fairly typical episode of amaurosis fugax and I will ask for his neurosurgeon to give me direction after his follow-up 02/09/2018 if he can be anticoagulated.  He is presently taking low-dose aspirin 2. Stable blood pressure target 3. Stable continue his statin   Next appointment: 2 months   Medication Adjustments/Labs and Tests Ordered: Current medicines are reviewed at length with the patient today.  Concerns regarding medicines are outlined above.  No orders of the defined types were placed in this encounter.  No orders of the defined types were placed in this encounter.   Chief Complaint  Patient presents with  . Follow-up  . Atrial Fibrillation  . Hypertension    History of Present Illness:    Lawrence Myers is a 84 y.o. male with a hx of PAF, hypertension and elevated Troponin due to demand ischemia  last seen 12/15/16.  ASSESSMENT:    12/15/16   1. Atrial fibrillation with rapid ventricular response (HCC)   2. Paroxysmal atrial fibrillation (HCC)   3. Hypertensive heart disease without heart failure   4. Elevated troponin    PLAN:    1. Stable he has had no recurrence symptomatically since leaving [facility] but in some fashion was not given prescription for beta-blocker, I have him resume at this time we will not put on a regular antiplatelet or anticoagulant.  He will return to my office on Monday for blood pressure check an EKG on beta-blocker.  If he has clinical recurrence of placement antiarrhythmic  drug. 2. See above 3. Stable continue current treatment ACE diuretic recheck renal function as well as CBC is mildly anemic leaving the hospital 4. Secondary to rapid atrial fibrillation or demand ischemia, this time I do not think he requires an ischemia evaluation although we will explore the issue at his next visit.  Compliance with diet, lifestyle and medications: yes He had transient visual loss OD 01/08/18.  The episode lasted about 10 minutes he has had no recurrence he had a CT angiogram of the neck that the wife told me look good copy requested from [facility] and if he is cleared by the neurosurgeon I would like to see him anticoagulated after his visit 02/09/2018 for now he will continue low-dose aspirin.  He is slowly and steadily improved no angina dyspnea palpitation or syncope  Past Medical History:  Diagnosis Date  . Atrial fibrillation with rapid ventricular response (HCC) 11/16/2017   Last Assessment & Plan:  Remains in sinus rhythm but his heart rate is around 100. Will add metoprolol 25 mg q.6 hours.  Continue to monitor.  As noted above recommend starting anticoagulant therapy when okay from a surgical stand point  . Bronchitis 10/20/2017  . Cervical stenosis of spinal canal 11/11/2017  . Chronic left shoulder pain 08/20/2017  . Chronic pain of right knee 08/20/2017  . Elevated troponin I level 11/16/2017   Last Assessment & Plan:  -given the lack of chest discomfort,  this most likely represents type 2 NSTEMI, demand ischemia, in the setting of rapid atrial fibrillation -echocardiogram shows EF of 50-55% -no further workup  . Essential hypertension 02/04/2017   Last Assessment & Plan:  -home regimen includes lisinopril-hydrochlorothiazide -will hold this in the setting of elevated creatinine -is on metoprolol at this time in the setting of AFib, will continue -continue monitor blood pressure and make adjustments as necessary  . GERD without esophagitis 08/20/2017  .  Hematoma of neck 11/19/2017  . Hyperlipidemia 11/16/2017   Last Assessment & Plan:  -continue simvastatin  . Hypokalemia 11/16/2017   Last Assessment & Plan:  Replete with IV fluids  . Impaired functional mobility, balance, gait, and endurance 12/11/2017  . Malaise and fatigue 08/20/2017  . Mixed dyslipidemia 02/04/2017  . Oropharyngeal dysphagia 12/11/2017  . Paroxysmal atrial fibrillation (HCC) 11/24/2017  . Productive cough 10/12/2017  . Stage 3 chronic kidney disease (HCC) 11/16/2017   Last Assessment & Plan:  Review of care everywhere shows previous creatinines over the last 6 months between 1.4 and 1.6.  Gently hydrating repleting potassium  . Status post cervical spinal fusion 11/16/2017   Last Assessment & Plan:  -underwent this March 13th in [place]) -complicated by moderate hematoma left anterior neck -has mild sore throat but able to swallow and currently without respiratory distress -however patient does report that he feels his hematoma is slightly enlarged today -he had been started on anticoagulation in the setting of AFIB and thromboembolic risk  Plan...  . Tick bite 04/22/2017  . Tinea cruris 08/20/2017  . Type 2 diabetes mellitus without complication, without long-term current use of insulin (HCC) 08/20/2017   Last Assessment & Plan:  -currently on metformin; holding during admission -POC glucose -SSI -diabetic diet  . Vitamin D deficiency 08/20/2017    Past Surgical History:  Procedure Laterality Date  . CARPAL TUNNEL RELEASE    . CERVICAL SPINE SURGERY    . ESOPHAGOGASTRECTOMY    . PROSTATE SURGERY      Current Medications: Current Meds  Medication Sig  . aspirin EC 81 MG tablet Take 81 mg by mouth daily.  Marland Kitchen lisinopril-hydrochlorothiazide (PRINZIDE,ZESTORETIC) 20-12.5 MG tablet Take 1 tablet by mouth daily.  . metFORMIN (GLUCOPHAGE) 500 MG tablet Take 1 tablet by mouth daily with breakfast.   . metoprolol succinate (TOPROL-XL) 50 MG 24 hr tablet Take 1  tablet (50 mg total) by mouth daily. Take with or immediately following a meal.  . simvastatin (ZOCOR) 40 MG tablet TAKE 1 TABLET EVERY DAY     Allergies:   Patient has no known allergies.   Social History   Socioeconomic History  . Marital status: Married    Spouse name: Not on file  . Number of children: Not on file  . Years of education: Not on file  . Highest education level: Not on file  Occupational History  . Not on file  Social Needs  . Financial resource strain: Not on file  . Food insecurity:    Worry: Not on file    Inability: Not on file  . Transportation needs:    Medical: Not on file    Non-medical: Not on file  Tobacco Use  . Smoking status: Former Games developer  . Smokeless tobacco: Never Used  Substance and Sexual Activity  . Alcohol use: Not Currently  . Drug use: Not Currently  . Sexual activity: Not on file  Lifestyle  . Physical activity:    Days per week: Not  on file    Minutes per session: Not on file  . Stress: Not on file  Relationships  . Social connections:    Talks on phone: Not on file    Gets together: Not on file    Attends religious service: Not on file    Active member of club or organization: Not on file    Attends meetings of clubs or organizations: Not on file    Relationship status: Not on file  Other Topics Concern  . Not on file  Social History Narrative  . Not on file     Family History: The patient's family history includes Arthritis in his father and mother; Heart disease in his mother. ROS:   Please see the history of present illness.    All other systems reviewed and are negative.  EKGs/Labs/Other Studies Reviewed:    The following studies were reviewed today: I requested a copy of the CT angiogram of his carotid vessels done at [facility]  Recent Labs: 12/15/2017: BUN 21; Creatinine, Ser 1.13; Hemoglobin 11.9; NT-Pro BNP 529; Platelets 178; Potassium 4.2; Sodium 139  Recent Lipid Panel No results found for:  CHOL, TRIG, HDL, CHOLHDL, VLDL, LDLCALC, LDLDIRECT  Physical Exam:    VS:  BP (!) 122/58 (BP Location: Right Arm, Patient Position: Sitting, Cuff Size: Normal)   Pulse 64   Ht  (1.702 m)   Wt 202 lb 12.8 oz (92 kg)   SpO2 98%   BMI 31.76 kg/m     Wt Readings from Last 3 Encounters:  01/27/18 202 lb 12.8 oz (92 kg)  12/15/17 192 lb (87.1 kg)     GEN:  Well nourished, well developed in no acute distress HEENT: Normal NECK: No JVD; No carotid bruits LYMPHATICS: No lymphadenopathy CARDIAC: He is obviously in sinus rhythm  RRR, no murmurs, rubs, gallops RESPIRATORY:  Clear to auscultation without rales, wheezing or rhonchi  ABDOMEN: Soft, non-tender, non-distended MUSCULOSKELETAL:  No edema; No deformity  SKIN: Warm and dry NEUROLOGIC:  Alert and oriented x 3 PSYCHIATRIC:  Normal affect    Signed, Norman Herrlich, MD  01/27/2018 3:19 PM    [facility]

## 2018-01-27 ENCOUNTER — Encounter: Payer: Self-pay | Admitting: Cardiology

## 2018-01-27 ENCOUNTER — Ambulatory Visit (INDEPENDENT_AMBULATORY_CARE_PROVIDER_SITE_OTHER): Payer: Medicare Other | Admitting: Cardiology

## 2018-01-27 VITALS — BP 122/58 | HR 64 | Ht 67.0 in | Wt 202.8 lb

## 2018-01-27 DIAGNOSIS — I480 Paroxysmal atrial fibrillation: Secondary | ICD-10-CM

## 2018-01-27 DIAGNOSIS — I119 Hypertensive heart disease without heart failure: Secondary | ICD-10-CM | POA: Diagnosis not present

## 2018-01-27 DIAGNOSIS — E782 Mixed hyperlipidemia: Secondary | ICD-10-CM | POA: Diagnosis not present

## 2018-01-27 NOTE — Patient Instructions (Signed)
Medication Instructions:  Your physician recommends that you continue on your current medications as directed. Please refer to the Current Medication list given to you today.   Labwork: None  Testing/Procedures: None  Follow-Up: Your physician wants you to follow-up in: 2 months. You will receive a reminder letter in the mail two months in advance. If you don't receive a letter, please call our office to schedule the follow-up appointment.   Any Other Special Instructions Will Be Listed Below (If Applicable).     If you need a refill on your cardiac medications before your next appointment, please call your pharmacy.   

## 2018-02-11 ENCOUNTER — Other Ambulatory Visit: Payer: Self-pay | Admitting: Orthopedic Surgery

## 2018-02-11 DIAGNOSIS — M4726 Other spondylosis with radiculopathy, lumbar region: Secondary | ICD-10-CM

## 2018-03-28 NOTE — Progress Notes (Deleted)
Cardiology Office Note:    Date:  03/28/2018   ID:  Lawrence Myers, DOB [birth year], MRN [id number]  PCP:  Hadley Penobbins, Robert A, MD  Cardiologist:  Norman HerrlichBrian Curlie Macken, MD    Referring MD: Hadley Penobbins, Robert A, MD    ASSESSMENT:    No diagnosis found. PLAN:    In order of problems listed above:  1. ***   Next appointment: ***   Medication Adjustments/Labs and Tests Ordered: Current medicines are reviewed at length with the patient today.  Concerns regarding medicines are outlined above.  No orders of the defined types were placed in this encounter.  No orders of the defined types were placed in this encounter.   No chief complaint on file.   History of Present Illness:    Lawrence Myers is a 84 y.o. male with a hx of PAF, hypertension , heart failure and elevated Troponin due to demand ischemia    last seen 01/27/18. Compliance with diet, lifestyle and medications: *** Past Medical History:  Diagnosis Date  . Atrial fibrillation with rapid ventricular response (HCC) 11/16/2017   Last Assessment & Plan:  Remains in sinus rhythm but his heart rate is around 100. Will add metoprolol 25 mg q.6 hours.  Continue to monitor.  As noted above recommend starting anticoagulant therapy when okay from a surgical stand point  . Bronchitis 10/20/2017  . Cervical stenosis of spinal canal 11/11/2017  . Chronic left shoulder pain 08/20/2017  . Chronic pain of right knee 08/20/2017  . Elevated troponin I level 11/16/2017   Last Assessment & Plan:  -given the lack of chest discomfort, this most likely represents type 2 NSTEMI, demand ischemia, in the setting of rapid atrial fibrillation -echocardiogram shows EF of 50-55% -no further workup  . Essential hypertension 02/04/2017   Last Assessment & Plan:  -home regimen includes lisinopril-hydrochlorothiazide -will hold this in the setting of elevated creatinine -is on metoprolol at this time in the setting of AFib, will continue -continue monitor  blood pressure and make adjustments as necessary  . GERD without esophagitis 08/20/2017  . Hematoma of neck 11/19/2017  . Hyperlipidemia 11/16/2017   Last Assessment & Plan:  -continue simvastatin  . Hypokalemia 11/16/2017   Last Assessment & Plan:  Replete with IV fluids  . Impaired functional mobility, balance, gait, and endurance 12/11/2017  . Malaise and fatigue 08/20/2017  . Mixed dyslipidemia 02/04/2017  . Oropharyngeal dysphagia 12/11/2017  . Paroxysmal atrial fibrillation (HCC) 11/24/2017  . Productive cough 10/12/2017  . Stage 3 chronic kidney disease (HCC) 11/16/2017   Last Assessment & Plan:  Review of care everywhere shows previous creatinines over the last 6 months between 1.4 and 1.6.  Gently hydrating repleting potassium  . Status post cervical spinal fusion 11/16/2017   Last Assessment & Plan:  -underwent this March 13th in [place]) -complicated by moderate hematoma left anterior neck -has mild sore throat but able to swallow and currently without respiratory distress -however patient does report that he feels his hematoma is slightly enlarged today -he had been started on anticoagulation in the setting of AFIB and thromboembolic risk  Plan...  . Tick bite 04/22/2017  . Tinea cruris 08/20/2017  . Type 2 diabetes mellitus without complication, without long-term current use of insulin (HCC) 08/20/2017   Last Assessment & Plan:  -currently on metformin; holding during admission -POC glucose -SSI -diabetic diet  . Vitamin D deficiency 08/20/2017    Past Surgical History:  Procedure Laterality Date  .  CARPAL TUNNEL RELEASE    . CERVICAL SPINE SURGERY    . ESOPHAGOGASTRECTOMY    . PROSTATE SURGERY      Current Medications: No outpatient medications have been marked as taking for the 03/29/18 encounter (Appointment) with Baldo Daub, MD.     Allergies:   Patient has no known allergies.   Social History   Socioeconomic History  . Marital status: Married     Spouse name: Not on file  . Number of children: Not on file  . Years of education: Not on file  . Highest education level: Not on file  Occupational History  . Not on file  Social Needs  . Financial resource strain: Not on file  . Food insecurity:    Worry: Not on file    Inability: Not on file  . Transportation needs:    Medical: Not on file    Non-medical: Not on file  Tobacco Use  . Smoking status: Former Games developer  . Smokeless tobacco: Never Used  Substance and Sexual Activity  . Alcohol use: Not Currently  . Drug use: Not Currently  . Sexual activity: Not on file  Lifestyle  . Physical activity:    Days per week: Not on file    Minutes per session: Not on file  . Stress: Not on file  Relationships  . Social connections:    Talks on phone: Not on file    Gets together: Not on file    Attends religious service: Not on file    Active member of club or organization: Not on file    Attends meetings of clubs or organizations: Not on file    Relationship status: Not on file  Other Topics Concern  . Not on file  Social History Narrative  . Not on file     Family History: The patient's ***family history includes Arthritis in his father and mother; Heart disease in his mother. ROS:   Please see the history of present illness.    All other systems reviewed and are negative.  EKGs/Labs/Other Studies Reviewed:    The following studies were reviewed today:  EKG:  EKG ordered today.  The ekg ordered today demonstrates ***  Recent Labs: 12/15/2017: BUN 21; Creatinine, Ser 1.13; Hemoglobin 11.9; NT-Pro BNP 529; Platelets 178; Potassium 4.2; Sodium 139  Recent Lipid Panel No results found for: CHOL, TRIG, HDL, CHOLHDL, VLDL, LDLCALC, LDLDIRECT  Physical Exam:    VS:  There were no vitals taken for this visit.    Wt Readings from Last 3 Encounters:  01/27/18 202 lb 12.8 oz (92 kg)  12/15/17 192 lb (87.1 kg)     GEN: *** Well nourished, well developed in no acute  distress HEENT: Normal NECK: No JVD; No carotid bruits LYMPHATICS: No lymphadenopathy CARDIAC: ***RRR, no murmurs, rubs, gallops RESPIRATORY:  Clear to auscultation without rales, wheezing or rhonchi  ABDOMEN: Soft, non-tender, non-distended MUSCULOSKELETAL:  No edema; No deformity  SKIN: Warm and dry NEUROLOGIC:  Alert and oriented x 3 PSYCHIATRIC:  Normal affect    Signed, Norman Herrlich, MD  03/28/2018 12:35 PM    [facility]

## 2018-03-29 ENCOUNTER — Ambulatory Visit: Payer: Medicare Other | Admitting: Cardiology

## 2018-04-28 IMAGING — MR MR CERVICAL SPINE W/O CM
4 of 5 series · 29 of 48 positions shown · non-contrast
Comparison: 08/20/2017 cervical radiographs

CLINICAL DATA: 89 y/o M; pain and numbness of the left shoulder,
arm, and neck for 3-4 weeks. History of fall 3 years ago.

EXAM:
MRI CERVICAL SPINE WITHOUT CONTRAST
TECHNIQUE: Multiplanar, multisequence MR imaging of the cervical spine was
performed. No intravenous contrast was administered.

[Series 5: T2 · sagittal · 3.0mm · 0.55mm/px · 7 of 19 slices shown (1 of 2)]
[im 1/19]
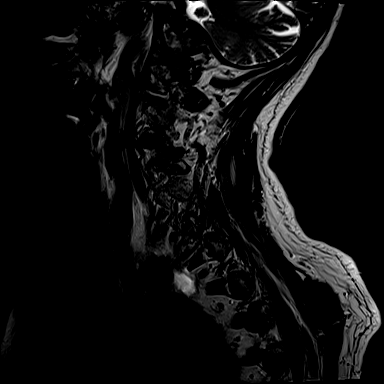
[im 4/19]
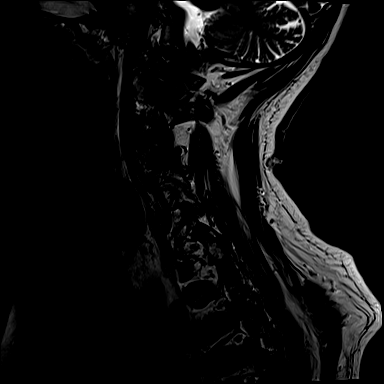
[im 7/19]
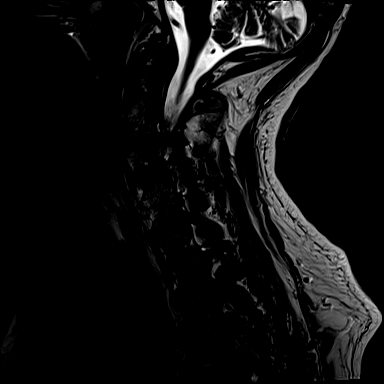
[im 10/19]
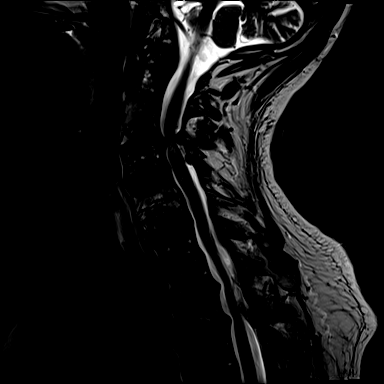
[im 13/19]
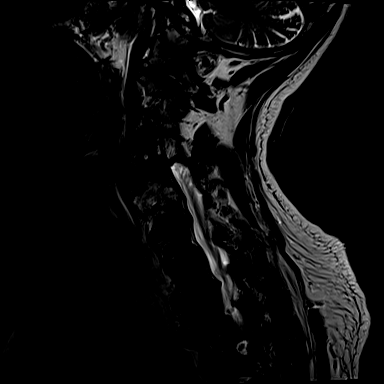
[im 16/19]
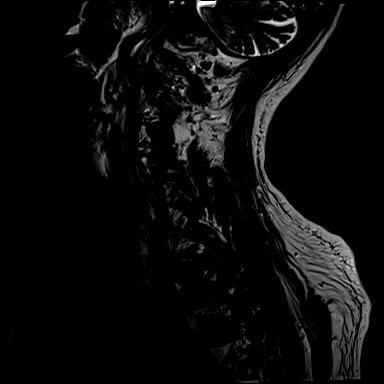
[im 19/19]
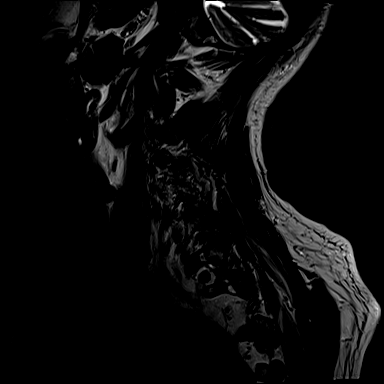

[Series 6: STIR · sagittal · 3.0mm · 0.33mm/px · 5 of 19 slices shown]
[im 1/19]
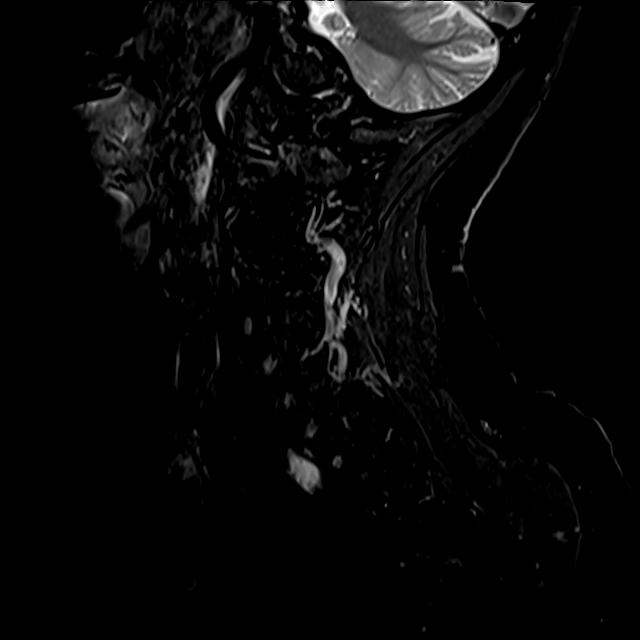
[im 4/19]
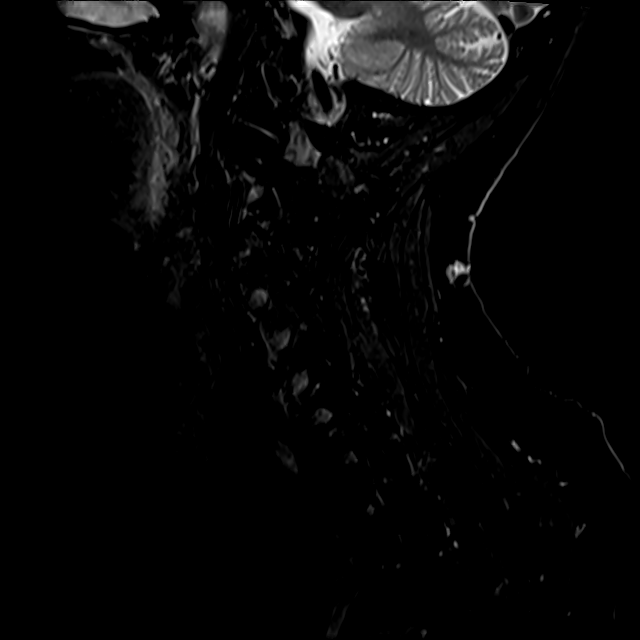
[im 7/19]
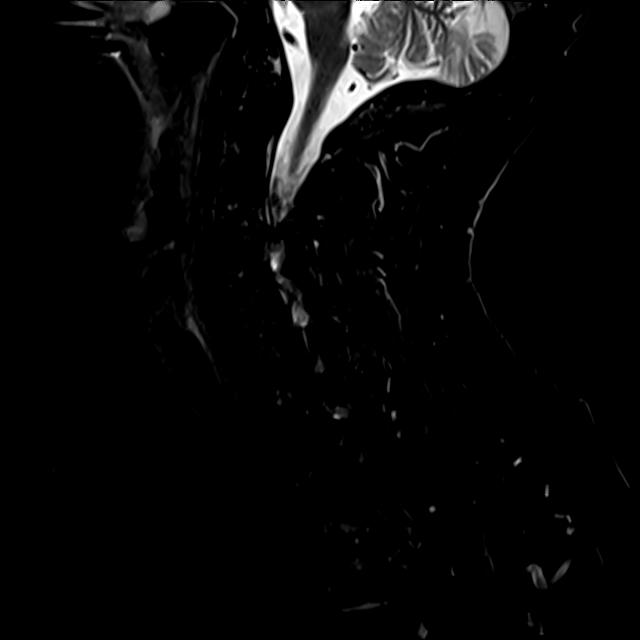
[im 10/19]
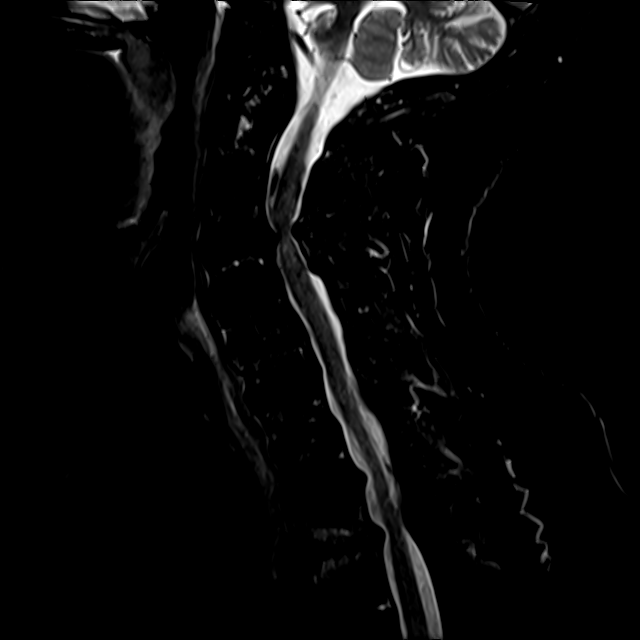
[im 16/19]
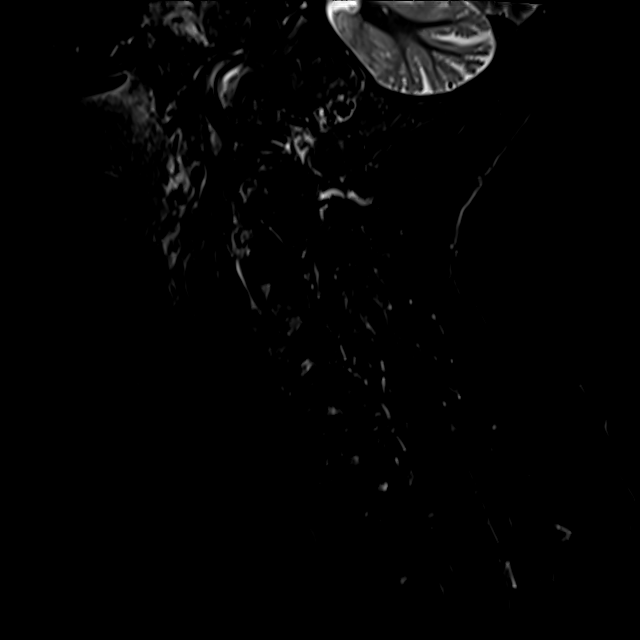

[Series 7: T1 · sagittal · 3.0mm · 0.66mm/px · 8 of 19 slices shown]
[im 1/19]
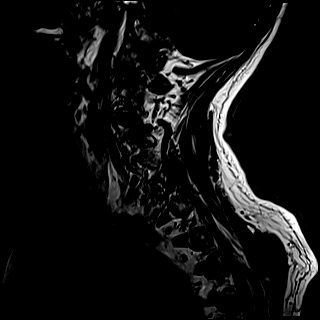
[im 3/19]
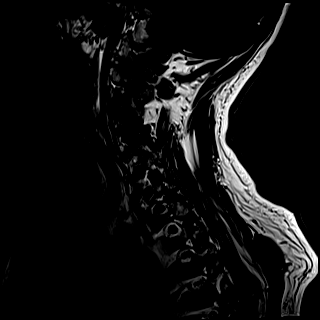
[im 6/19]
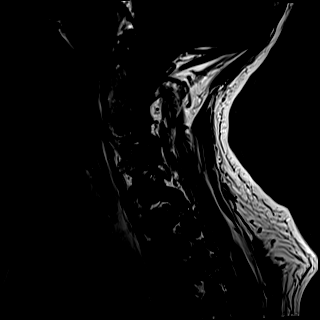
[im 8/19]
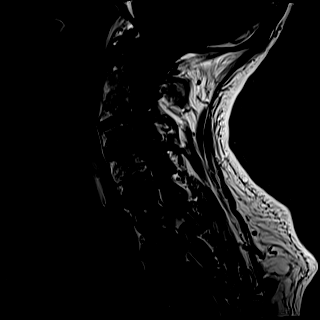
[im 11/19]
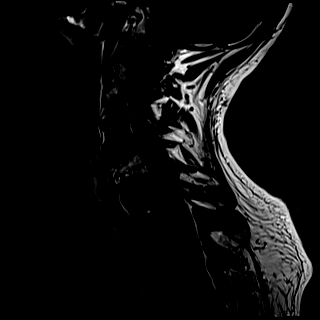
[im 13/19]
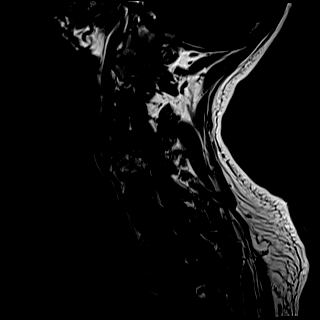
[im 16/19]
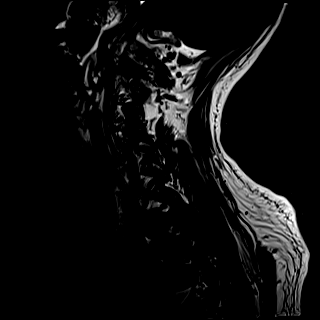
[im 19/19]
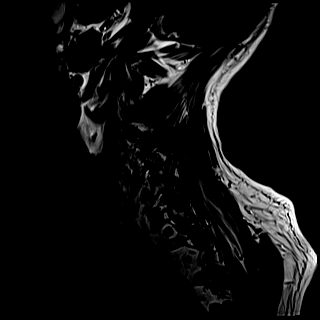

[Series 8: T2 · axial · 3.0mm · 0.56mm/px · z∈[-68,+28]mm · 9 of 32 slices shown (2 of 2)]
[im 1/32]
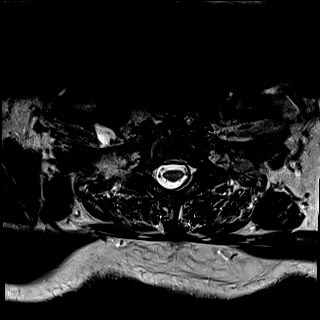
[im 6/32]
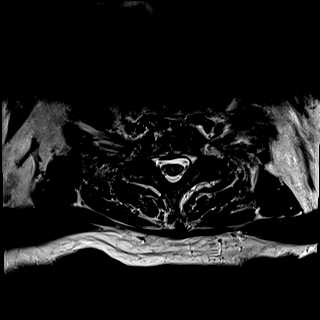
[im 11/32]
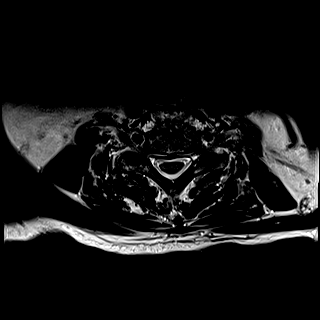
[im 13/32]
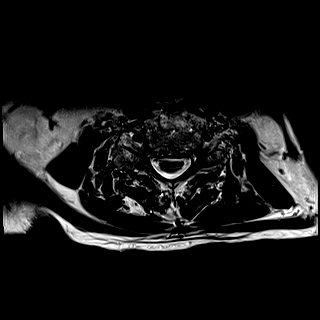
[im 16/32]
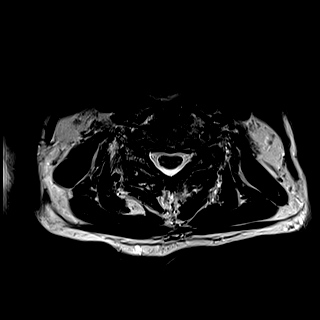
[im 19/32]
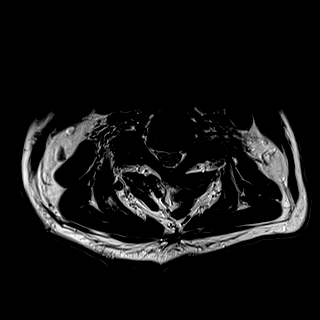
[im 21/32]
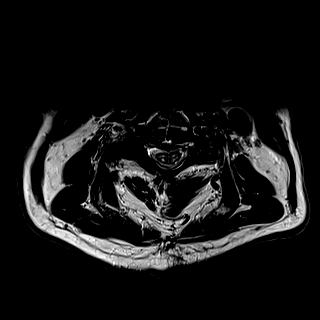
[im 26/32]
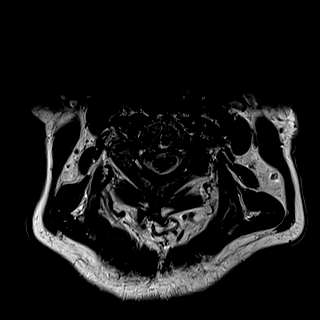
[im 32/32]
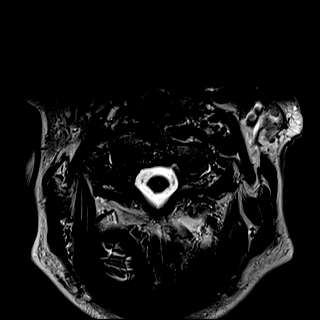

[29 of 48 positions shown; findings below may reference images not displayed]

FINDINGS: Alignment: Draining of cervical lordosis, no listhesis.

Vertebrae: Degenerative C2-3, C5-6, and T2-3 endplate edema. Small
effusions are present within the C1-2 lateral joints, likely
degenerative. No findings of acute fracture or discitis.

Cord: Increased cord signal at C3-4 level. Otherwise no abnormal
cord signal.

Posterior Fossa, vertebral arteries, paraspinal tissues: Negative.

Disc levels:

C2-3: Disc osteophyte complex with right-sided uncovertebral and
facet hypertrophy. Mild right foraminal stenosis. No canal stenosis.

C3-4: Disc osteophyte complex eccentric to the right with bilateral
uncovertebral and facet hypertrophy. Severe foraminal and canal
stenosis. Cord compression.

C4-5: Disc osteophyte complex with uncovertebral and facet
hypertrophy. Severe foraminal stenosis. Mild canal stenosis.

C5-6: Disc osteophyte complex with bilateral uncovertebral and facet
hypertrophy. Mild bilateral foraminal stenosis. No canal stenosis.

C6-7: Disc osteophyte complex with right greater than left
uncovertebral and facet hypertrophy. Moderate right and mild left
foraminal stenosis. No canal stenosis.

C7-T1: No significant disc displacement, foraminal stenosis, or
canal stenosis.
IMPRESSION: 1. Advanced cervical spondylosis greatest at C3-4 and C4-5 levels.
2. Multifactorial severe C3-4 canal stenosis with compressive
myelopathy of the cord.
3. Multilevel mild and moderate foraminal stenosis. Severe bilateral
C3-4 and C4-5 foraminal stenosis.
4. No acute osseous abnormality.
These results will be called to the ordering clinician or
representative by the Radiologist Assistant, and communication
documented in the PACS or zVision Dashboard.

By: Teisha Nope M.D.

## 2019-08-27 DIAGNOSIS — N400 Enlarged prostate without lower urinary tract symptoms: Secondary | ICD-10-CM | POA: Diagnosis not present

## 2019-08-27 DIAGNOSIS — J189 Pneumonia, unspecified organism: Secondary | ICD-10-CM | POA: Diagnosis not present

## 2019-08-27 DIAGNOSIS — E119 Type 2 diabetes mellitus without complications: Secondary | ICD-10-CM | POA: Diagnosis not present

## 2019-08-27 DIAGNOSIS — I10 Essential (primary) hypertension: Secondary | ICD-10-CM | POA: Diagnosis not present

## 2019-08-27 DIAGNOSIS — I361 Nonrheumatic tricuspid (valve) insufficiency: Secondary | ICD-10-CM | POA: Diagnosis not present

## 2019-08-27 DIAGNOSIS — I340 Nonrheumatic mitral (valve) insufficiency: Secondary | ICD-10-CM | POA: Diagnosis not present

## 2019-08-28 DIAGNOSIS — N400 Enlarged prostate without lower urinary tract symptoms: Secondary | ICD-10-CM | POA: Diagnosis not present

## 2019-08-28 DIAGNOSIS — E119 Type 2 diabetes mellitus without complications: Secondary | ICD-10-CM | POA: Diagnosis not present

## 2019-08-28 DIAGNOSIS — I10 Essential (primary) hypertension: Secondary | ICD-10-CM | POA: Diagnosis not present

## 2019-08-28 DIAGNOSIS — J189 Pneumonia, unspecified organism: Secondary | ICD-10-CM | POA: Diagnosis not present

## 2019-11-18 ENCOUNTER — Other Ambulatory Visit: Payer: Self-pay

## 2019-11-18 ENCOUNTER — Ambulatory Visit: Payer: Medicare Other | Admitting: Cardiology

## 2019-11-18 ENCOUNTER — Encounter: Payer: Self-pay | Admitting: Cardiology

## 2019-11-18 VITALS — BP 124/76 | HR 76 | Temp 97.1°F | Ht 67.0 in | Wt 210.8 lb

## 2019-11-18 DIAGNOSIS — E782 Mixed hyperlipidemia: Secondary | ICD-10-CM

## 2019-11-18 DIAGNOSIS — I480 Paroxysmal atrial fibrillation: Secondary | ICD-10-CM | POA: Diagnosis not present

## 2019-11-18 DIAGNOSIS — I110 Hypertensive heart disease with heart failure: Secondary | ICD-10-CM

## 2019-11-18 DIAGNOSIS — I5032 Chronic diastolic (congestive) heart failure: Secondary | ICD-10-CM

## 2019-11-18 MED ORDER — FUROSEMIDE 20 MG PO TABS: 20.0000 mg | ORAL_TABLET | Freq: Every day | ORAL | 3 refills | Status: DC

## 2019-11-18 NOTE — Progress Notes (Signed)
Cardiology Office Note:    Date:  11/18/2019   ID:  Lawrence Myers, DOB 01/31/1927, MRN [id number]  PCP:  Hadley Pen, MD  Cardiologist:  Norman Herrlich, MD    Referring MD: Hadley Pen, MD    ASSESSMENT:    1. Hypertensive heart disease with chronic diastolic congestive heart failure (HCC)   2. Paroxysmal atrial fibrillation (HCC)   3. Mixed hyperlipidemia    PLAN:    In order of problems listed above:  1. Clinically appears to have heart failure with edema typical symptoms and substrate with hypertension atrial fibrillation.  We will start him on a diuretic furosemide 20 mg daily have him back Monday or Tuesday for lab work BMP proBNP and reassess in 2 weeks.  This time I do not think we need to recheck an echocardiogram 2. Stable I maintaining sinus rhythm 3. Stable continue with statin lipid profile will be done next week   Next appointment: 3 weeks   Medication Adjustments/Labs and Tests Ordered: Current medicines are reviewed at length with the patient today.  Concerns regarding medicines are outlined above.  No orders of the defined types were placed in this encounter.  No orders of the defined types were placed in this encounter.   Chief Complaint  Patient presents with  . Shortness of Breath    History of Present Illness:    Lawrence Myers is a 86 y.o. male with a hx of PAF, hypertension and elevated Troponin due to demand ischemia  last seen 01/27/2018. Compliance with diet, lifestyle and medications: Yes  He was seen by his primary care physician 316 for shortness of breath and edema.  Chest x-ray showed chronic lung disease with increased interstitial markings.  EKG in the office showed sinus rhythm left atrial enlargement I independently reviewed that EKG.  Laboratory studies showed a creatinine of 1.15 potassium 3.2 GFR 56 cc globin 11.4  Slow steady pattern over the period of weeks of exertional shortness of breath especially  walking to the mailbox a pillow orthopnea peripheral edema and severe shortness of breath bending over quickly.  No background history of heart failure is in sinus rhythm.  His echocardiogram showed ejection fraction below normal 50 to 55% demand ischemia.  Pneumonia at the end of December and was briefly hospitalized at that time and has not COVID-19.  He also wheezes intermittently. Past Medical History:  Diagnosis Date  . Atrial fibrillation with rapid ventricular response (HCC) 11/16/2017   Last Assessment & Plan:  Remains in sinus rhythm but his heart rate is around 100. Will add metoprolol 25 mg q.6 hours.  Continue to monitor.  As noted above recommend starting anticoagulant therapy when okay from a surgical stand point  . Bronchitis 10/20/2017  . Cervical stenosis of spinal canal 11/11/2017  . Chronic left shoulder pain 08/20/2017  . Chronic pain of right knee 08/20/2017  . Elevated troponin I level 11/16/2017   Last Assessment & Plan:  -given the lack of chest discomfort, this most likely represents type 2 NSTEMI, demand ischemia, in the setting of rapid atrial fibrillation -echocardiogram shows EF of 50-55% -no further workup  . Essential hypertension 02/04/2017   Last Assessment & Plan:  -home regimen includes lisinopril-hydrochlorothiazide -will hold this in the setting of elevated creatinine -is on metoprolol at this time in the setting of AFib, will continue -continue monitor blood pressure and make adjustments as necessary  . GERD without esophagitis 08/20/2017  . Hematoma of neck 11/19/2017  .  Hyperlipidemia 11/16/2017   Last Assessment & Plan:  -continue simvastatin  . Hypokalemia 11/16/2017   Last Assessment & Plan:  Replete with IV fluids  . Impaired functional mobility, balance, gait, and endurance 12/11/2017  . Malaise and fatigue 08/20/2017  . Mixed dyslipidemia 02/04/2017  . Oropharyngeal dysphagia 12/11/2017  . Paroxysmal atrial fibrillation (HCC) 11/24/2017  . Productive cough  10/12/2017  . Stage 3 chronic kidney disease 11/16/2017   Last Assessment & Plan:  Review of care everywhere shows previous creatinines over the last 6 months between 1.4 and 1.6.  Gently hydrating repleting potassium  . Status post cervical spinal fusion 11/16/2017   Last Assessment & Plan:  -underwent this March 13th in [place]) -complicated by moderate hematoma left anterior neck -has mild sore throat but able to swallow and currently without respiratory distress -however patient does report that he feels his hematoma is slightly enlarged today -he had been started on anticoagulation in the setting of AFIB and thromboembolic risk  Plan...  . Tick bite 04/22/2017  . Tinea cruris 08/20/2017  . Type 2 diabetes mellitus without complication, without long-term current use of insulin (HCC) 08/20/2017   Last Assessment & Plan:  -currently on metformin; holding during admission -POC glucose -SSI -diabetic diet  . Vitamin D deficiency 08/20/2017    Past Surgical History:  Procedure Laterality Date  . CARPAL TUNNEL RELEASE    . CERVICAL SPINE SURGERY    . ESOPHAGOGASTRECTOMY    . PROSTATE SURGERY      Current Medications: Current Meds  Medication Sig  . aspirin EC 81 MG tablet Take 81 mg by mouth daily.  . metFORMIN (GLUCOPHAGE) 500 MG tablet Take 1 tablet by mouth daily with breakfast.   . omeprazole (PRILOSEC) 20 MG capsule Take 1 capsule by mouth daily.  . simvastatin (ZOCOR) 40 MG tablet TAKE 1 TABLET EVERY DAY  . tamsulosin (FLOMAX) 0.4 MG CAPS capsule Take 0.4 mg by mouth in the morning and at bedtime.     Allergies:   Patient has no known allergies.   Social History   Socioeconomic History  . Marital status: Married    Spouse name: Not on file  . Number of children: Not on file  . Years of education: Not on file  . Highest education level: Not on file  Occupational History  . Not on file  Tobacco Use  . Smoking status: Former Games developer  . Smokeless tobacco:  Never Used  Substance and Sexual Activity  . Alcohol use: Not Currently  . Drug use: Not Currently  . Sexual activity: Not on file  Other Topics Concern  . Not on file  Social History Narrative  . Not on file   Social Determinants of Health   Financial Resource Strain:   . Difficulty of Paying Living Expenses:   Food Insecurity:   . Worried About Programme researcher, broadcasting/film/video in the Last Year:   . Barista in the Last Year:   Transportation Needs:   . Freight forwarder (Medical):   Marland Kitchen Lack of Transportation (Non-Medical):   Physical Activity:   . Days of Exercise per Week:   . Minutes of Exercise per Session:   Stress:   . Feeling of Stress :   Social Connections:   . Frequency of Communication with Friends and Family:   . Frequency of Social Gatherings with Friends and Family:   . Attends Religious Services:   . Active Member of Clubs or Organizations:   .  Attends Archivist Meetings:   Marland Kitchen Marital Status:      Family History: The patient's family history includes Arthritis in his father and mother; Heart disease in his mother. ROS:   Please see the history of present illness.    All other systems reviewed and are negative.  EKGs/Labs/Other Studies Reviewed:    The following studies were reviewed today:  HPI for chest x-ray EKG and lab results  Physical Exam:    VS:  BP 124/76   Pulse 76   Temp (!) 97.1 F (36.2 C)   Ht 5\' 7"  (1.702 m)   Wt 210 lb 12.8 oz (95.6 kg)   SpO2 98%   BMI 33.02 kg/m     Wt Readings from Last 3 Encounters:  11/18/19 210 lb 12.8 oz (95.6 kg)  01/27/18 202 lb 12.8 oz (92 kg)  12/15/17 192 lb (87.1 kg)     GEN:  Well nourished, well developed in no acute distress HEENT: Normal NECK: Mild JVD; No carotid bruits LYMPHATICS: No lymphadenopathy CARDIAC: RRR, no murmurs, rubs, gallops RESPIRATORY:  Clear to auscultation without rales, wheezing or rhonchi  ABDOMEN: Soft, non-tender, non-distended MUSCULOSKELETAL:  Bilateral pitting 2-3+ lower extremity edema; No deformity  SKIN: Warm and dry NEUROLOGIC:  Alert and oriented x 3 PSYCHIATRIC:  Normal affect    Signed, Shirlee More, MD  11/18/2019 3:42 PM    [facility]

## 2019-11-18 NOTE — Patient Instructions (Addendum)
Medication Instructions:  Your physician has recommended you make the following change in your medication:  1.  START Lasix 20 mg taking 1 tablet daily STARTING TODAY   *If you need a refill on your cardiac medications before your next appointment, please call your pharmacy*   Lab Work: Monday OR Tuesday:  COME BACK TO THE OFFICE FOR BMET, PRO BNP & LIPID  If you have labs (blood work) drawn today and your tests are completely normal, you will receive your results only by: Marland Kitchen MyChart Message (if you have MyChart) OR . A paper copy in the mail If you have any lab test that is abnormal or we need to change your treatment, we will call you to review the results.   Testing/Procedures: None ordered   Follow-Up: At [facility], you and your health needs are our priority.  As part of our continuing mission to provide you with exceptional heart care, we have created designated Provider Care Teams.  These Care Teams include your primary Cardiologist (physician) and Advanced Practice Providers (APPs -  Physician Assistants and Nurse Practitioners) who all work together to provide you with the care you need, when you need it.  We recommend signing up for the patient portal called "MyChart".  Sign up information is provided on this After Visit Summary.  MyChart is used to connect with patients for Virtual Visits (Telemedicine).  Patients are able to view lab/test results, encounter notes, upcoming appointments, etc.  Non-urgent messages can be sent to your provider as well.   To learn more about what you can do with MyChart, go to [email or web address].    Your next appointment:   3 WEEKS  The format for your next appointment:   In Person  Provider:   Norman Herrlich, MD   Other Instructions

## 2019-11-21 ENCOUNTER — Other Ambulatory Visit: Payer: Self-pay | Admitting: *Deleted

## 2019-11-21 ENCOUNTER — Other Ambulatory Visit: Payer: Self-pay

## 2019-11-21 DIAGNOSIS — I480 Paroxysmal atrial fibrillation: Secondary | ICD-10-CM

## 2019-11-21 DIAGNOSIS — E782 Mixed hyperlipidemia: Secondary | ICD-10-CM

## 2019-11-21 DIAGNOSIS — I5032 Chronic diastolic (congestive) heart failure: Secondary | ICD-10-CM

## 2019-11-21 DIAGNOSIS — I110 Hypertensive heart disease with heart failure: Secondary | ICD-10-CM

## 2019-11-22 LAB — BASIC METABOLIC PANEL
BUN/Creatinine Ratio: 14 (ref 10–24)
BUN: 21 mg/dL (ref 10–36)
CO2: 24 mmol/L (ref 20–29)
Calcium: 9.5 mg/dL (ref 8.6–10.2)
Chloride: 101 mmol/L (ref 96–106)
Creatinine, Ser: 1.55 mg/dL — ABNORMAL HIGH (ref 0.76–1.27)
GFR calc Af Amer: 45 mL/min/{1.73_m2} — ABNORMAL LOW (ref >59)
GFR calc non Af Amer: 39 mL/min/{1.73_m2} — ABNORMAL LOW (ref >59)
Glucose: 78 mg/dL (ref 65–99)
Potassium: 4.2 mmol/L (ref 3.5–5.2)
Sodium: 140 mmol/L (ref 134–144)

## 2019-11-22 LAB — PRO B NATRIURETIC PEPTIDE: NT-Pro BNP: 1907 pg/mL — ABNORMAL HIGH (ref 0–486)

## 2019-12-07 NOTE — Progress Notes (Signed)
Cardiology Office Note:    Date:  12/08/2019   ID:  Lawrence Myers, DOB Aug 12, 1927, MRN [id number]  PCP:  Myrlene Broker, MD  Cardiologist:  Shirlee More, MD    Referring MD: Myrlene Broker, MD    ASSESSMENT:    1. Hypertensive heart disease with chronic diastolic congestive heart failure ([place])   2. Chronic diastolic heart failure (HCC)   3. Paroxysmal atrial fibrillation ([place])   4. Mixed hyperlipidemia    PLAN:    In order of problems listed above:  1. He is improved symptomatically we will recheck his renal function he has an element of CKD stage III as well proBNP and continue his current diuretic his sodium restriction if elevated he thinks he needs to take extra dose of furosemide. 2. Stable no recurrence I would not electively anticoagulant with unsteady gait and previous major hemorrhagic complication after spine surgery continue his beta-blocker 3. Stable continue with statin he is overdue do a lipid profile my office today along with BMP and proBNP.   Next appointment: 3 months   Medication Adjustments/Labs and Tests Ordered: Current medicines are reviewed at length with the patient today.  Concerns regarding medicines are outlined above.  No orders of the defined types were placed in this encounter.  No orders of the defined types were placed in this encounter.   Chief Complaint  Patient presents with  . Follow-up  . Congestive Heart Failure    History of Present Illness:    Lawrence Myers is a 86 y.o. male with a hx of paroxysmal atrial fibrillation hypertension previous demand ischemia with elevated troponin and recent heart failure last seen 11/18/2019. Compliance with diet, lifestyle and medications: Yes, his weight is down 6 pounds from his last visit.  He is doing better he is not short of breath and has minimal edema sodium restriction takes his diuretic.  His biggest problem now is unsteadiness had a fall at home I reminded him to use  his cane took a moment to tell him that the greatest threat to his independence and living at home is frequent falls and injuries.  He is not having syncope or near syncope chest pain and has had no rapid heart rhythm.  Ref Range & Units 2 wk ago 1 yr ago   NT-Pro BNP 0 - 486 pg/mL 1,907High   529   Echocardiogram performed 11/23/2017 showed normal left ventricular size and function ejection fraction estimated 55 to 60%. Past Medical History:  Diagnosis Date  . Atrial fibrillation with rapid ventricular response ([place]) 11/16/2017   Last Assessment & Plan:  Remains in sinus rhythm but his heart rate is around 100. Will add metoprolol 25 mg q.6 hours.  Continue to monitor.  As noted above recommend starting anticoagulant therapy when okay from a surgical stand point  . Bronchitis 10/20/2017  . Cervical stenosis of spinal canal 11/11/2017  . Chronic left shoulder pain 08/20/2017  . Chronic pain of right knee 08/20/2017  . Elevated troponin I level 11/16/2017   Last Assessment & Plan:  -given the lack of chest discomfort, this most likely represents type 2 NSTEMI, demand ischemia, in the setting of rapid atrial fibrillation -echocardiogram shows EF of 50-55% -no further workup  . Essential hypertension 02/04/2017   Last Assessment & Plan:  -home regimen includes lisinopril-hydrochlorothiazide -will hold this in the setting of elevated creatinine -is on metoprolol at this time in the setting of AFib, will continue -continue monitor blood pressure and  make adjustments as necessary  . GERD without esophagitis 08/20/2017  . Hematoma of neck 11/19/2017  . Hyperlipidemia 11/16/2017   Last Assessment & Plan:  -continue simvastatin  . Hypokalemia 11/16/2017   Last Assessment & Plan:  Replete with IV fluids  . Impaired functional mobility, balance, gait, and endurance 12/11/2017  . Malaise and fatigue 08/20/2017  . Mixed dyslipidemia 02/04/2017  . Oropharyngeal dysphagia 12/11/2017  . Paroxysmal atrial fibrillation  (HCC) 11/24/2017  . Productive cough 10/12/2017  . Stage 3 chronic kidney disease 11/16/2017   Last Assessment & Plan:  Review of care everywhere shows previous creatinines over the last 6 months between 1.4 and 1.6.  Gently hydrating repleting potassium  . Status post cervical spinal fusion 11/16/2017   Last Assessment & Plan:  -underwent this March 13th in [place]) -complicated by moderate hematoma left anterior neck -has mild sore throat but able to swallow and currently without respiratory distress -however patient does report that he feels his hematoma is slightly enlarged today -he had been started on anticoagulation in the setting of AFIB and thromboembolic risk  Plan...  . Tick bite 04/22/2017  . Tinea cruris 08/20/2017  . Type 2 diabetes mellitus without complication, without long-term current use of insulin (HCC) 08/20/2017   Last Assessment & Plan:  -currently on metformin; holding during admission -POC glucose -SSI -diabetic diet  . Vitamin D deficiency 08/20/2017    Past Surgical History:  Procedure Laterality Date  . CARPAL TUNNEL RELEASE    . CERVICAL SPINE SURGERY    . ESOPHAGOGASTRECTOMY    . PROSTATE SURGERY      Current Medications: Current Meds  Medication Sig  . aspirin EC 81 MG tablet Take 81 mg by mouth daily.  . furosemide (LASIX) 20 MG tablet Take 1 tablet (20 mg total) by mouth daily.  . metFORMIN (GLUCOPHAGE) 500 MG tablet Take 1 tablet by mouth daily with breakfast.   . metoprolol succinate (TOPROL-XL) 50 MG 24 hr tablet Take 1 tablet (50 mg total) by mouth daily. Take with or immediately following a meal.  . omeprazole (PRILOSEC) 20 MG capsule Take 1 capsule by mouth daily.  . simvastatin (ZOCOR) 40 MG tablet TAKE 1 TABLET EVERY DAY  . tamsulosin (FLOMAX) 0.4 MG CAPS capsule Take 0.4 mg by mouth in the morning and at bedtime.     Allergies:   Patient has no known allergies.   Social History   Socioeconomic History  . Marital status:  Married    Spouse name: Not on file  . Number of children: Not on file  . Years of education: Not on file  . Highest education level: Not on file  Occupational History  . Not on file  Tobacco Use  . Smoking status: Former Games developer  . Smokeless tobacco: Never Used  Substance and Sexual Activity  . Alcohol use: Not Currently  . Drug use: Not Currently  . Sexual activity: Not on file  Other Topics Concern  . Not on file  Social History Narrative  . Not on file   Social Determinants of Health   Financial Resource Strain:   . Difficulty of Paying Living Expenses:   Food Insecurity:   . Worried About Programme researcher, broadcasting/film/video in the Last Year:   . Barista in the Last Year:   Transportation Needs:   . Freight forwarder (Medical):   Marland Kitchen Lack of Transportation (Non-Medical):   Physical Activity:   . Days of Exercise  per Week:   . Minutes of Exercise per Session:   Stress:   . Feeling of Stress :   Social Connections:   . Frequency of Communication with Friends and Family:   . Frequency of Social Gatherings with Friends and Family:   . Attends Religious Services:   . Active Member of Clubs or Organizations:   . Attends Banker Meetings:   Marland Kitchen Marital Status:      Family History: The patient's family history includes Arthritis in his father and mother; Heart disease in his mother. ROS:   Please see the history of present illness.    All other systems reviewed and are negative.  EKGs/Labs/Other Studies Reviewed:    The following studies were reviewed today:    Recent Labs: 11/21/2019: BUN 21; Creatinine, Ser 1.55; NT-Pro BNP 1,907; Potassium 4.2; Sodium 140  Recent Lipid Panel His last lipid profile at his PCP office 01/12/2019 cholesterol 128 triglyceride 122 LDL 72 HDL 35  Physical Exam:    VS:  BP (!) 150/70 (BP Location: Right Arm, Patient Position: Sitting, Cuff Size: Normal)   Pulse (!) 56   Ht 5\' 7"  (1.702 m)   Wt 204 lb (92.5 kg)   SpO2 98%    BMI 31.95 kg/m     Wt Readings from Last 3 Encounters:  12/08/19 204 lb (92.5 kg)  11/18/19 210 lb 12.8 oz (95.6 kg)  01/27/18 202 lb 12.8 oz (92 kg)     GEN: He is no longer short of breath and does not look nearly as paralysis last visit well nourished, well developed in no acute distress HEENT: Normal NECK: No JVD; No carotid bruits LYMPHATICS: No lymphadenopathy CARDIAC: RRR, no murmurs, rubs, gallops RESPIRATORY:  Clear to auscultation without rales, wheezing or rhonchi  ABDOMEN: Soft, non-tender, non-distended MUSCULOSKELETAL: Trace to 1+ lower extremity about the ankle pitting bilateral edema; No deformity  SKIN: Warm and dry NEUROLOGIC:  Alert and oriented x 3 PSYCHIATRIC:  Normal affect    Signed, 01/29/18, MD  12/08/2019 2:51 PM    [facility]

## 2019-12-08 ENCOUNTER — Other Ambulatory Visit: Payer: Self-pay

## 2019-12-08 ENCOUNTER — Encounter: Payer: Self-pay | Admitting: Cardiology

## 2019-12-08 ENCOUNTER — Ambulatory Visit: Payer: Medicare Other | Admitting: Cardiology

## 2019-12-08 VITALS — BP 150/70 | HR 56 | Ht 67.0 in | Wt 204.0 lb

## 2019-12-08 DIAGNOSIS — I110 Hypertensive heart disease with heart failure: Secondary | ICD-10-CM | POA: Diagnosis not present

## 2019-12-08 DIAGNOSIS — I5032 Chronic diastolic (congestive) heart failure: Secondary | ICD-10-CM

## 2019-12-08 DIAGNOSIS — I480 Paroxysmal atrial fibrillation: Secondary | ICD-10-CM | POA: Diagnosis not present

## 2019-12-08 DIAGNOSIS — E782 Mixed hyperlipidemia: Secondary | ICD-10-CM | POA: Diagnosis not present

## 2019-12-08 NOTE — Patient Instructions (Addendum)
Medication Instructions:  Your physician recommends that you continue on your current medications as directed. Please refer to the Current Medication list given to you today.  *If you need a refill on your cardiac medications before your next appointment, please call your pharmacy*   Lab Work: Your physician recommends that you return for lab work in: TODAY BMP, ProBNP, lipids If you have labs (blood work) drawn today and your tests are completely normal, you will receive your results only by: Marland Kitchen MyChart Message (if you have MyChart) OR . A paper copy in the mail If you have any lab test that is abnormal or we need to change your treatment, we will call you to review the results.   Testing/Procedures: None   Follow-Up: At [facility], you and your health needs are our priority.  As part of our continuing mission to provide you with exceptional heart care, we have created designated Provider Care Teams.  These Care Teams include your primary Cardiologist (physician) and Advanced Practice Providers (APPs -  Physician Assistants and Nurse Practitioners) who all work together to provide you with the care you need, when you need it.  We recommend signing up for the patient portal called "MyChart".  Sign up information is provided on this After Visit Summary.  MyChart is used to connect with patients for Virtual Visits (Telemedicine).  Patients are able to view lab/test results, encounter notes, upcoming appointments, etc.  Non-urgent messages can be sent to your provider as well.   To learn more about what you can do with MyChart, go to [email or web address].    Your next appointment:   3 month(s)  The format for your next appointment:   In Person  Provider:   Norman Herrlich, MD   Other Instructions Please use your cane when walking around, even at home!

## 2019-12-09 LAB — BASIC METABOLIC PANEL
BUN/Creatinine Ratio: 15 (ref 10–24)
BUN: 27 mg/dL (ref 10–36)
CO2: 23 mmol/L (ref 20–29)
Calcium: 9.5 mg/dL (ref 8.6–10.2)
Chloride: 102 mmol/L (ref 96–106)
Creatinine, Ser: 1.76 mg/dL — ABNORMAL HIGH (ref 0.76–1.27)
GFR calc Af Amer: 38 mL/min/{1.73_m2} — ABNORMAL LOW (ref >59)
GFR calc non Af Amer: 33 mL/min/{1.73_m2} — ABNORMAL LOW (ref >59)
Glucose: 93 mg/dL (ref 65–99)
Potassium: 4.1 mmol/L (ref 3.5–5.2)
Sodium: 139 mmol/L (ref 134–144)

## 2019-12-09 LAB — LIPID PANEL
Chol/HDL Ratio: 2.7 ratio (ref 0.0–5.0)
Cholesterol, Total: 105 mg/dL (ref 100–199)
HDL: 39 mg/dL — ABNORMAL LOW (ref >39)
LDL Chol Calc (NIH): 49 mg/dL (ref 0–99)
Triglycerides: 87 mg/dL (ref 0–149)
VLDL Cholesterol Cal: 17 mg/dL (ref 5–40)

## 2019-12-09 LAB — PRO B NATRIURETIC PEPTIDE: NT-Pro BNP: 1191 pg/mL — ABNORMAL HIGH (ref 0–486)

## 2020-03-08 DIAGNOSIS — R0789 Other chest pain: Secondary | ICD-10-CM

## 2020-03-08 HISTORY — DX: Other chest pain: R07.89

## 2020-03-18 NOTE — Progress Notes (Deleted)
Cardiology Office Note:    Date:  03/18/2020   ID:  Lawrence Myers, DOB [birth year], MRN [id number]  PCP:  Hadley Pen, MD  Cardiologist:  Norman Herrlich, MD    Referring MD: Hadley Pen, MD    ASSESSMENT:    No diagnosis found. PLAN:    In order of problems listed above:  1. ***   Next appointment: ***   Medication Adjustments/Labs and Tests Ordered: Current medicines are reviewed at length with the patient today.  Concerns regarding medicines are outlined above.  No orders of the defined types were placed in this encounter.  No orders of the defined types were placed in this encounter.   No chief complaint on file.   History of Present Illness:    Lawrence Myers is a 86 y.o. male with a hx of paroxysmal atrial fibrillation hypertension three CKD previous demand ischemia with elevated troponin and recent heart failure last seen December 08, 2019.  He has not anticoagulated with his previous major hemorrhagic complication after spine surgery and unsteady gait. Compliance with diet, lifestyle and medications: ***  His BNP level has been stable:  3 mo ago  (12/08/19) 3 mo ago  (11/21/19) 2 yr ago  (12/15/17)   NT-Pro BNP 0 - 486 pg/mL 1,191High  1,907High CM  529High    Past Medical History:  Diagnosis Date  . Atrial fibrillation with rapid ventricular response (HCC) 11/16/2017   Last Assessment & Plan:  Remains in sinus rhythm but his heart rate is around 100. Will add metoprolol 25 mg q.6 hours.  Continue to monitor.  As noted above recommend starting anticoagulant therapy when okay from a surgical stand point  . Bronchitis 10/20/2017  . Cervical stenosis of spinal canal 11/11/2017  . Chronic left shoulder pain 08/20/2017  . Chronic pain of right knee 08/20/2017  . Elevated troponin I level 11/16/2017   Last Assessment & Plan:  -given the lack of chest discomfort, this most likely represents type 2 NSTEMI, demand ischemia, in the setting of  rapid atrial fibrillation -echocardiogram shows EF of 50-55% -no further workup  . Essential hypertension 02/04/2017   Last Assessment & Plan:  -home regimen includes lisinopril-hydrochlorothiazide -will hold this in the setting of elevated creatinine -is on metoprolol at this time in the setting of AFib, will continue -continue monitor blood pressure and make adjustments as necessary  . GERD without esophagitis 08/20/2017  . Hematoma of neck 11/19/2017  . Hyperlipidemia 11/16/2017   Last Assessment & Plan:  -continue simvastatin  . Hypokalemia 11/16/2017   Last Assessment & Plan:  Replete with IV fluids  . Impaired functional mobility, balance, gait, and endurance 12/11/2017  . Malaise and fatigue 08/20/2017  . Mixed dyslipidemia 02/04/2017  . Oropharyngeal dysphagia 12/11/2017  . Paroxysmal atrial fibrillation (HCC) 11/24/2017  . Productive cough 10/12/2017  . Stage 3 chronic kidney disease 11/16/2017   Last Assessment & Plan:  Review of care everywhere shows previous creatinines over the last 6 months between 1.4 and 1.6.  Gently hydrating repleting potassium  . Status post cervical spinal fusion 11/16/2017   Last Assessment & Plan:  -underwent this March 13th in [place] -complicated by moderate hematoma left anterior neck -has mild sore throat but able to swallow and currently without respiratory distress -however patient does report that he feels his hematoma is slightly enlarged today -he had been started on anticoagulation in the setting of AFIB and thromboembolic risk  Plan...  . Tick  bite 04/22/2017  . Tinea cruris 08/20/2017  . Type 2 diabetes mellitus without complication, without long-term current use of insulin (HCC) 08/20/2017   Last Assessment & Plan:  -currently on metformin; holding during admission -POC glucose -SSI -diabetic diet  . Vitamin D deficiency 08/20/2017    Past Surgical History:  Procedure Laterality Date  . CARPAL TUNNEL RELEASE    . CERVICAL SPINE  SURGERY    . ESOPHAGOGASTRECTOMY    . PROSTATE SURGERY      Current Medications: No outpatient medications have been marked as taking for the 03/20/20 encounter (Appointment) with Baldo Daub, MD.     Allergies:   Patient has no known allergies.   Social History   Socioeconomic History  . Marital status: Married    Spouse name: Not on file  . Number of children: Not on file  . Years of education: Not on file  . Highest education level: Not on file  Occupational History  . Not on file  Tobacco Use  . Smoking status: Former Games developer  . Smokeless tobacco: Never Used  Vaping Use  . Vaping Use: Never used  Substance and Sexual Activity  . Alcohol use: Not Currently  . Drug use: Not Currently  . Sexual activity: Not on file  Other Topics Concern  . Not on file  Social History Narrative  . Not on file   Social Determinants of Health   Financial Resource Strain:   . Difficulty of Paying Living Expenses:   Food Insecurity:   . Worried About Programme researcher, broadcasting/film/video in the Last Year:   . Barista in the Last Year:   Transportation Needs:   . Freight forwarder (Medical):   Marland Kitchen Lack of Transportation (Non-Medical):   Physical Activity:   . Days of Exercise per Week:   . Minutes of Exercise per Session:   Stress:   . Feeling of Stress :   Social Connections:   . Frequency of Communication with Friends and Family:   . Frequency of Social Gatherings with Friends and Family:   . Attends Religious Services:   . Active Member of Clubs or Organizations:   . Attends Banker Meetings:   Marland Kitchen Marital Status:      Family History: The patient's ***family history includes Arthritis in his father and mother; Heart disease in his mother. ROS:   Please see the history of present illness.    All other systems reviewed and are negative.  EKGs/Labs/Other Studies Reviewed:    The following studies were reviewed today:  EKG:  EKG ordered today and personally  reviewed.  The ekg ordered today demonstrates ***  Recent Labs: 12/08/2019: BUN 27; Creatinine, Ser 1.76; NT-Pro BNP 1,191; Potassium 4.1; Sodium 139  Recent Lipid Panel    Component Value Date/Time   CHOL 105 12/08/2019 1506   TRIG 87 12/08/2019 1506   HDL 39 (L) 12/08/2019 1506   CHOLHDL 2.7 12/08/2019 1506   LDLCALC 49 12/08/2019 1506    Physical Exam:    VS:  There were no vitals taken for this visit.    Wt Readings from Last 3 Encounters:  12/08/19 204 lb (92.5 kg)  11/18/19 210 lb 12.8 oz (95.6 kg)  01/27/18 202 lb 12.8 oz (92 kg)     GEN: *** Well nourished, well developed in no acute distress HEENT: Normal NECK: No JVD; No carotid bruits LYMPHATICS: No lymphadenopathy CARDIAC: ***RRR, no murmurs, rubs, gallops RESPIRATORY:  Clear to auscultation  without rales, wheezing or rhonchi  ABDOMEN: Soft, non-tender, non-distended MUSCULOSKELETAL:  No edema; No deformity  SKIN: Warm and dry NEUROLOGIC:  Alert and oriented x 3 PSYCHIATRIC:  Normal affect    Signed, Norman Herrlich, MD  03/18/2020 11:37 AM    [facility]

## 2020-03-20 ENCOUNTER — Ambulatory Visit: Payer: Medicare Other | Admitting: Cardiology

## 2020-03-21 DIAGNOSIS — I361 Nonrheumatic tricuspid (valve) insufficiency: Secondary | ICD-10-CM | POA: Diagnosis not present

## 2020-03-21 DIAGNOSIS — I340 Nonrheumatic mitral (valve) insufficiency: Secondary | ICD-10-CM | POA: Diagnosis not present

## 2020-03-22 DIAGNOSIS — E785 Hyperlipidemia, unspecified: Secondary | ICD-10-CM | POA: Diagnosis not present

## 2020-03-22 DIAGNOSIS — I119 Hypertensive heart disease without heart failure: Secondary | ICD-10-CM | POA: Diagnosis not present

## 2020-03-22 DIAGNOSIS — I480 Paroxysmal atrial fibrillation: Secondary | ICD-10-CM | POA: Diagnosis not present

## 2020-03-22 DIAGNOSIS — I21A1 Myocardial infarction type 2: Secondary | ICD-10-CM | POA: Diagnosis not present

## 2020-03-23 DIAGNOSIS — I480 Paroxysmal atrial fibrillation: Secondary | ICD-10-CM | POA: Diagnosis not present

## 2020-03-23 DIAGNOSIS — I119 Hypertensive heart disease without heart failure: Secondary | ICD-10-CM | POA: Diagnosis not present

## 2020-03-23 DIAGNOSIS — I21A1 Myocardial infarction type 2: Secondary | ICD-10-CM | POA: Diagnosis not present

## 2020-03-23 DIAGNOSIS — E785 Hyperlipidemia, unspecified: Secondary | ICD-10-CM | POA: Diagnosis not present

## 2020-03-24 DIAGNOSIS — E785 Hyperlipidemia, unspecified: Secondary | ICD-10-CM

## 2020-03-24 DIAGNOSIS — I10 Essential (primary) hypertension: Secondary | ICD-10-CM

## 2020-03-24 DIAGNOSIS — I480 Paroxysmal atrial fibrillation: Secondary | ICD-10-CM | POA: Diagnosis not present

## 2020-03-24 DIAGNOSIS — I119 Hypertensive heart disease without heart failure: Secondary | ICD-10-CM | POA: Diagnosis not present

## 2020-03-24 DIAGNOSIS — I21A1 Myocardial infarction type 2: Secondary | ICD-10-CM

## 2020-03-26 ENCOUNTER — Telehealth: Payer: Self-pay | Admitting: Cardiology

## 2020-03-26 NOTE — Telephone Encounter (Signed)
She has for some into the schedule in 1 week we never have any openings.

## 2020-03-26 NOTE — Telephone Encounter (Signed)
Patient's wife is requesting to schedule a hospital follow up appointment with Dr. Dulce Sellar within 1 week. However, Dr. Dulce Sellar does not have any availability in 1 week. Patient's wife is requesting to have patient worked into schedule. Please call.

## 2020-03-27 NOTE — Telephone Encounter (Signed)
Follow Up:     Wife called to see what the status was of getting patient in for a sooner appt

## 2020-03-29 NOTE — Progress Notes (Signed)
Cardiology Office Note:    Date:  03/30/2020   ID:  COLBURN ASPER, DOB 01/20/1927, MRN [id number]  PCP:  Hadley Pen, MD  Cardiologist:  Norman Herrlich, MD    Referring MD: Hadley Pen, MD    ASSESSMENT:    1. Paroxysmal atrial fibrillation (HCC)   2. On amiodarone therapy   3. Elevated troponin   4. Hypertensive heart disease with chronic diastolic congestive heart failure (HCC)   5. Mixed hyperlipidemia    PLAN:    In order of problems listed above:  1. He is doing well maintaining sinus rhythm and was loaded IV with amiodarone in hospital.  Next visit reduce dose to 200 mg/day recheck labs including CMP lipids amiodarone level.  I told her he is having frequent occurrences we have to revisit anticoagulation perhaps with reduced dose Eliquis 2. He had demand ischemia or type II MI with sustained rapid rhythms greater than 160 for several days prior to admission to the hospital.  We had nice discussion hospital with the patient wife stepdaughter and after review of options preferred not to undergo coronary angiography and revascularization and we elected medical therapy and antiarrhythmic drug to avoid rapid atrial fibrillation.  He will continue aspirin beta-blocker and a statin. 3. BP at target continue current treatment he takes a long-acting calcium channel blocker 4. He is now taking pravastatin to avoid drug interactions with amiodarone we will check a lipid profile next visit he has had no muscle pain or weakness   Next appointment: 4 weeks   Medication Adjustments/Labs and Tests Ordered: Current medicines are reviewed at length with the patient today.  Concerns regarding medicines are outlined above.  No orders of the defined types were placed in this encounter.  No orders of the defined types were placed in this encounter.   Chief Complaint  Patient presents with  . Follow-up  . Atrial Fibrillation    History of Present Illness:    Lawrence Myers is a 86 y.o. male with a hx of  paroxysmal atrial fibrillation hypertension previous demand ischemia with elevated troponin and  heart failure   He was seen at [facility] last seen with recurrent sustained atrial fibrillation rates greater than 160 bpm with troponin elevation felt to be type II myocardial infarction.  We decided not to anticoagulate him with his previous spinal cord compression requiring surgery with anticoagulation we initiated him on antiarrhythmic therapy with amiodarone and was discharged to home care.  Mildly decompensated heart failure in hospital from IV fluid loading and holding his diuretic. Compliance with diet, lifestyle and medications: Yes  Both Castle and his wife are very pleased with his quality of life he now feels well he has had no palpitation shortness of breath or chest pain tolerates amiodarone home heart rates run in the 60s 70 bpm and blood pressure typically is less than 1 40-1 50 which is at goal systolic in his age group.  He has had no edema or shortness of breath.  He has not anticoagulated because of his previous major hemorrhagic complication with spinal cord compression.  In hospital he was transitioned from simvastatin to pravastatin to avoid drug interaction. Past Medical History:  Diagnosis Date  . Atrial fibrillation with rapid ventricular response (HCC) 11/16/2017   Last Assessment & Plan:  Remains in sinus rhythm but his heart rate is around 100. Will add metoprolol 25 mg q.6 hours.  Continue to monitor.  As noted above recommend starting  anticoagulant therapy when okay from a surgical stand point  . Bronchitis 10/20/2017  . Cervical stenosis of spinal canal 11/11/2017  . Chronic left shoulder pain 08/20/2017  . Chronic pain of right knee 08/20/2017  . Elevated troponin I level 11/16/2017   Last Assessment & Plan:  -given the lack of chest discomfort, this most likely represents type 2 NSTEMI, demand ischemia, in the setting of rapid  atrial fibrillation -echocardiogram shows EF of 50-55% -no further workup  . Essential hypertension 02/04/2017   Last Assessment & Plan:  -home regimen includes lisinopril-hydrochlorothiazide -will hold this in the setting of elevated creatinine -is on metoprolol at this time in the setting of AFib, will continue -continue monitor blood pressure and make adjustments as necessary  . GERD without esophagitis 08/20/2017  . Hematoma of neck 11/19/2017  . Hyperlipidemia 11/16/2017   Last Assessment & Plan:  -continue simvastatin  . Hypokalemia 11/16/2017   Last Assessment & Plan:  Replete with IV fluids  . Impaired functional mobility, balance, gait, and endurance 12/11/2017  . Malaise and fatigue 08/20/2017  . Mixed dyslipidemia 02/04/2017  . Oropharyngeal dysphagia 12/11/2017  . Paroxysmal atrial fibrillation (HCC) 11/24/2017  . Productive cough 10/12/2017  . Stage 3 chronic kidney disease 11/16/2017   Last Assessment & Plan:  Review of care everywhere shows previous creatinines over the last 6 months between 1.4 and 1.6.  Gently hydrating repleting potassium  . Status post cervical spinal fusion 11/16/2017   Last Assessment & Plan:  -underwent this March 13th in [place] -complicated by moderate hematoma left anterior neck -has mild sore throat but able to swallow and currently without respiratory distress -however patient does report that he feels his hematoma is slightly enlarged today -he had been started on anticoagulation in the setting of AFIB and thromboembolic risk  Plan...  . Tick bite 04/22/2017  . Tinea cruris 08/20/2017  . Type 2 diabetes mellitus without complication, without long-term current use of insulin (HCC) 08/20/2017   Last Assessment & Plan:  -currently on metformin; holding during admission -POC glucose -SSI -diabetic diet  . Vitamin D deficiency 08/20/2017    Past Surgical History:  Procedure Laterality Date  . CARPAL TUNNEL RELEASE    . CERVICAL SPINE SURGERY     . ESOPHAGOGASTRECTOMY    . PROSTATE SURGERY      Current Medications: Current Meds  Medication Sig  . amiodarone (PACERONE) 200 MG tablet Take 200 mg by mouth in the morning and at bedtime.  Marland Kitchen amLODipine (NORVASC) 10 MG tablet Take 10 mg by mouth daily.  Marland Kitchen aspirin EC 81 MG tablet Take 81 mg by mouth daily.  . furosemide (LASIX) 40 MG tablet Take 40 mg by mouth daily.  . metFORMIN (GLUCOPHAGE) 500 MG tablet Take 1 tablet by mouth daily with breakfast.   . metoprolol succinate (TOPROL-XL) 50 MG 24 hr tablet Take 1 tablet (50 mg total) by mouth daily. Take with or immediately following a meal.  . omeprazole (PRILOSEC) 20 MG capsule Take 1 capsule by mouth daily.  . pravastatin (PRAVACHOL) 40 MG tablet Take 40 mg by mouth at bedtime.  . tamsulosin (FLOMAX) 0.4 MG CAPS capsule Take 0.4 mg by mouth in the morning and at bedtime.     Allergies:   Patient has no known allergies.   Social History   Socioeconomic History  . Marital status: Married    Spouse name: Not on file  . Number of children: Not on file  . Years  of education: Not on file  . Highest education level: Not on file  Occupational History  . Not on file  Tobacco Use  . Smoking status: Former Games developer  . Smokeless tobacco: Never Used  Vaping Use  . Vaping Use: Never used  Substance and Sexual Activity  . Alcohol use: Not Currently  . Drug use: Not Currently  . Sexual activity: Not on file  Other Topics Concern  . Not on file  Social History Narrative  . Not on file   Social Determinants of Health   Financial Resource Strain:   . Difficulty of Paying Living Expenses:   Food Insecurity:   . Worried About Programme researcher, broadcasting/film/video in the Last Year:   . Barista in the Last Year:   Transportation Needs:   . Freight forwarder (Medical):   Marland Kitchen Lack of Transportation (Non-Medical):   Physical Activity:   . Days of Exercise per Week:   . Minutes of Exercise per Session:   Stress:   . Feeling of Stress :     Social Connections:   . Frequency of Communication with Friends and Family:   . Frequency of Social Gatherings with Friends and Family:   . Attends Religious Services:   . Active Member of Clubs or Organizations:   . Attends Banker Meetings:   Marland Kitchen Marital Status:      Family History: The patient's family history includes Arthritis in his father and mother; Heart disease in his mother. ROS:   Please see the history of present illness.    All other systems reviewed and are negative.  EKGs/Labs/Other Studies Reviewed:    The following studies were reviewed today:  EKG:  EKG ordered today and personally reviewed.  The ekg ordered today demonstrates sinus rhythm normal  Recent Labs: 12/08/2019: BUN 27; Creatinine, Ser 1.76; NT-Pro BNP 1,191; Potassium 4.1; Sodium 139  Recent Lipid Panel    Component Value Date/Time   CHOL 105 12/08/2019 1506   TRIG 87 12/08/2019 1506   HDL 39 (L) 12/08/2019 1506   CHOLHDL 2.7 12/08/2019 1506   LDLCALC 49 12/08/2019 1506    Physical Exam:    VS:  BP (!) 140/60   Pulse 54   Ht 5\' 7"  (1.702 m)   Wt 199 lb 12.8 oz (90.6 kg)   SpO2 96%   BMI 31.29 kg/m     Wt Readings from Last 3 Encounters:  03/30/20 199 lb 12.8 oz (90.6 kg)  12/08/19 204 lb (92.5 kg)  11/18/19 210 lb 12.8 oz (95.6 kg)     GEN:  Well nourished, well developed in no acute distress HEENT: Normal NECK: No JVD; No carotid bruits LYMPHATICS: No lymphadenopathy CARDIAC: RRR, no murmurs, rubs, gallops RESPIRATORY:  Clear to auscultation without rales, wheezing or rhonchi  ABDOMEN: Soft, non-tender, non-distended MUSCULOSKELETAL:  No edema; No deformity  SKIN: Warm and dry NEUROLOGIC:  Alert and oriented x 3 PSYCHIATRIC:  Normal affect    Signed, 11/20/19, MD  03/30/2020 11:57 AM    [facility]

## 2020-03-30 ENCOUNTER — Encounter: Payer: Self-pay | Admitting: Cardiology

## 2020-03-30 ENCOUNTER — Other Ambulatory Visit: Payer: Self-pay

## 2020-03-30 ENCOUNTER — Ambulatory Visit: Payer: Medicare Other | Admitting: Cardiology

## 2020-03-30 VITALS — BP 140/60 | HR 54 | Ht 67.0 in | Wt 199.8 lb

## 2020-03-30 DIAGNOSIS — Z79899 Other long term (current) drug therapy: Secondary | ICD-10-CM

## 2020-03-30 DIAGNOSIS — E782 Mixed hyperlipidemia: Secondary | ICD-10-CM

## 2020-03-30 DIAGNOSIS — I480 Paroxysmal atrial fibrillation: Secondary | ICD-10-CM

## 2020-03-30 DIAGNOSIS — R7989 Other specified abnormal findings of blood chemistry: Secondary | ICD-10-CM

## 2020-03-30 DIAGNOSIS — R778 Other specified abnormalities of plasma proteins: Secondary | ICD-10-CM | POA: Diagnosis not present

## 2020-03-30 DIAGNOSIS — I110 Hypertensive heart disease with heart failure: Secondary | ICD-10-CM

## 2020-03-30 DIAGNOSIS — I5032 Chronic diastolic (congestive) heart failure: Secondary | ICD-10-CM

## 2020-03-30 NOTE — Patient Instructions (Signed)
Medication Instructions:  Your physician recommends that you continue on your current medications as directed. Please refer to the Current Medication list given to you today.  *If you need a refill on your cardiac medications before your next appointment, please call your pharmacy*   Lab Work: None If you have labs (blood work) drawn today and your tests are completely normal, you will receive your results only by: MyChart Message (if you have MyChart) OR A paper copy in the mail If you have any lab test that is abnormal or we need to change your treatment, we will call you to review the results.   Testing/Procedures: None   Follow-Up: At [facility], you and your health needs are our priority.  As part of our continuing mission to provide you with exceptional heart care, we have created designated Provider Care Teams.  These Care Teams include your primary Cardiologist (physician) and Advanced Practice Providers (APPs -  Physician Assistants and Nurse Practitioners) who all work together to provide you with the care you need, when you need it.  We recommend signing up for the patient portal called "MyChart".  Sign up information is provided on this After Visit Summary.  MyChart is used to connect with patients for Virtual Visits (Telemedicine).  Patients are able to view lab/test results, encounter notes, upcoming appointments, etc.  Non-urgent messages can be sent to your provider as well.   To learn more about what you can do with MyChart, go to [email or web address].    Your next appointment:   4 week(s)  The format for your next appointment:   In Person  Provider:   Brian Munley, MD   Other Instructions   

## 2020-04-09 ENCOUNTER — Telehealth: Payer: Self-pay | Admitting: Cardiology

## 2020-04-09 NOTE — Telephone Encounter (Signed)
Called and informed patient wife per dpr to decrease amiodarone to 200 mg daily and scheduled him for a follow up with Dr. Servando Salina since there is nothing available with Dr. Servando Salina.

## 2020-04-09 NOTE — Telephone Encounter (Signed)
Pt c/o medication issue:  1. Name of Medication: amiodarone (PACERONE) 200 MG tablet  2. How are you currently taking this medication (dosage and times per day)? 200 mg by mouth in the morning and at bedtime  3. Are you having a reaction (difficulty breathing--STAT)? No  4. What is your medication issue? Patient's wife states the medication is causing weakness and staggering. Patient denies pain or additional symptoms. Please call to discuss.

## 2020-04-09 NOTE — Telephone Encounter (Signed)
That will be fine. 

## 2020-04-09 NOTE — Telephone Encounter (Signed)
Please have Lawrence Myers take the amiodarone 200 mg once daily.  Have the patient see Dr. Dulce Sellar in the next week.

## 2020-04-09 NOTE — Telephone Encounter (Signed)
Called and spoke to patient's wife per dpr. She reports since patient started on amiodarone 200 mg twice daily he has been weak, and dizzy. They would like to know if patient can decrease or stop this medication since he isn't feeling well since starting it. Will check with Dr. Servando Salina since Dr. Dulce Sellar is off.

## 2020-04-09 NOTE — Telephone Encounter (Signed)
Dr. Dulce Sellar doesn't have any availability. Can I schedule him with you?

## 2020-04-26 ENCOUNTER — Encounter: Payer: Self-pay | Admitting: Cardiology

## 2020-04-26 ENCOUNTER — Ambulatory Visit: Payer: Medicare Other | Admitting: Cardiology

## 2020-04-26 ENCOUNTER — Other Ambulatory Visit: Payer: Self-pay

## 2020-04-26 VITALS — BP 120/58 | HR 55 | Ht 67.0 in | Wt 201.0 lb

## 2020-04-26 DIAGNOSIS — I110 Hypertensive heart disease with heart failure: Secondary | ICD-10-CM

## 2020-04-26 DIAGNOSIS — E782 Mixed hyperlipidemia: Secondary | ICD-10-CM

## 2020-04-26 DIAGNOSIS — N1831 Chronic kidney disease, stage 3a: Secondary | ICD-10-CM

## 2020-04-26 DIAGNOSIS — I480 Paroxysmal atrial fibrillation: Secondary | ICD-10-CM | POA: Diagnosis not present

## 2020-04-26 DIAGNOSIS — I5032 Chronic diastolic (congestive) heart failure: Secondary | ICD-10-CM

## 2020-04-26 MED ORDER — PRAVASTATIN SODIUM 40 MG PO TABS: 40.0000 mg | ORAL_TABLET | Freq: Every day | ORAL | 0 refills | Status: DC

## 2020-04-26 MED ORDER — AMLODIPINE BESYLATE 10 MG PO TABS: 10.0000 mg | ORAL_TABLET | Freq: Every day | ORAL | 0 refills | Status: DC

## 2020-04-26 MED ORDER — AMIODARONE HCL 200 MG PO TABS: 200.0000 mg | ORAL_TABLET | Freq: Two times a day (BID) | ORAL | 0 refills | Status: DC

## 2020-04-26 MED ORDER — PRAVASTATIN SODIUM 40 MG PO TABS: 40.0000 mg | ORAL_TABLET | Freq: Every day | ORAL | 3 refills | Status: DC

## 2020-04-26 MED ORDER — METOPROLOL SUCCINATE ER 25 MG PO TB24: 25.0000 mg | ORAL_TABLET | Freq: Every day | ORAL | 3 refills | Status: DC

## 2020-04-26 MED ORDER — FUROSEMIDE 40 MG PO TABS: 40.0000 mg | ORAL_TABLET | Freq: Every day | ORAL | 0 refills | Status: DC

## 2020-04-26 MED ORDER — FUROSEMIDE 40 MG PO TABS: 40.0000 mg | ORAL_TABLET | Freq: Every day | ORAL | 3 refills | Status: DC

## 2020-04-26 MED ORDER — ASPIRIN EC 81 MG PO TBEC: 81.0000 mg | DELAYED_RELEASE_TABLET | Freq: Every day | ORAL | 0 refills | Status: AC

## 2020-04-26 MED ORDER — AMIODARONE HCL 200 MG PO TABS: 200.0000 mg | ORAL_TABLET | Freq: Two times a day (BID) | ORAL | 3 refills | Status: DC

## 2020-04-26 MED ORDER — ASPIRIN EC 81 MG PO TBEC: 81.0000 mg | DELAYED_RELEASE_TABLET | Freq: Every day | ORAL | 3 refills | Status: DC

## 2020-04-26 MED ORDER — METOPROLOL SUCCINATE ER 25 MG PO TB24: 25.0000 mg | ORAL_TABLET | Freq: Every day | ORAL | 0 refills | Status: DC

## 2020-04-26 MED ORDER — AMLODIPINE BESYLATE 10 MG PO TABS: 10.0000 mg | ORAL_TABLET | Freq: Every day | ORAL | 3 refills | Status: DC

## 2020-04-26 NOTE — Patient Instructions (Signed)

## 2020-04-26 NOTE — Progress Notes (Signed)
Cardiology Office Note:    Date:  04/26/2020   ID:  Lawrence Myers, DOB [birth year], MRN [id number]  PCP:  Hadley Penobbins, Robert A, MD  Cardiologist:  No primary care provider on file.  Electrophysiologist:  None   Referring MD: Hadley Penobbins, Robert A, MD  Follow-up visit  History of Present Illness:    Lawrence Myers is a 86 y.o. male with a hx of paroxysmal atrial fibrillation on amiodarone, hypertensive heart disease with chronic diastolic heart failure and hyperlipidemia.  The patient saw Dr. Dulce SellarMunley on March 30, 2020 at that time he was continued on his amiodarone.  In the interim the patient called reporting that he was experiencing significant daily dizziness with amiodarone twice a day we therefore switched his amiodarone to 200 mg daily.  Today patient is here with his wife they have no complaints at this time.  Past Medical History:  Diagnosis Date  . Atrial fibrillation with rapid ventricular response (HCC) 11/16/2017   Last Assessment & Plan:  Remains in sinus rhythm but his heart rate is around 100. Will add metoprolol 25 mg q.6 hours.  Continue to monitor.  As noted above recommend starting anticoagulant therapy when okay from a surgical stand point  . Bronchitis 10/20/2017  . Cervical stenosis of spinal canal 11/11/2017  . Chronic left shoulder pain 08/20/2017  . Chronic pain of right knee 08/20/2017  . Elevated troponin I level 11/16/2017   Last Assessment & Plan:  -given the lack of chest discomfort, this most likely represents type 2 NSTEMI, demand ischemia, in the setting of rapid atrial fibrillation -echocardiogram shows EF of 50-55% -no further workup  . Essential hypertension 02/04/2017   Last Assessment & Plan:  -home regimen includes lisinopril-hydrochlorothiazide -will hold this in the setting of elevated creatinine -is on metoprolol at this time in the setting of AFib, will continue -continue monitor blood pressure and make adjustments as necessary  . GERD without  esophagitis 08/20/2017  . Hematoma of neck 11/19/2017  . Hyperlipidemia 11/16/2017   Last Assessment & Plan:  -continue simvastatin  . Hypokalemia 11/16/2017   Last Assessment & Plan:  Replete with IV fluids  . Impaired functional mobility, balance, gait, and endurance 12/11/2017  . Malaise and fatigue 08/20/2017  . Mixed dyslipidemia 02/04/2017  . Oropharyngeal dysphagia 12/11/2017  . Paroxysmal atrial fibrillation (HCC) 11/24/2017  . Productive cough 10/12/2017  . Stage 3 chronic kidney disease 11/16/2017   Last Assessment & Plan:  Review of care everywhere shows previous creatinines over the last 6 months between 1.4 and 1.6.  Gently hydrating repleting potassium  . Status post cervical spinal fusion 11/16/2017   Last Assessment & Plan:  -underwent this March 13th in [place]([facility]) -complicated by moderate hematoma left anterior neck -has mild sore throat but able to swallow and currently without respiratory distress -however patient does report that he feels his hematoma is slightly enlarged today -he had been started on anticoagulation in the setting of AFIB and thromboembolic risk  Plan...  . Tick bite 04/22/2017  . Tinea cruris 08/20/2017  . Type 2 diabetes mellitus without complication, without long-term current use of insulin (HCC) 08/20/2017   Last Assessment & Plan:  -currently on metformin; holding during admission -POC glucose -SSI -diabetic diet  . Vitamin D deficiency 08/20/2017    Past Surgical History:  Procedure Laterality Date  . CARPAL TUNNEL RELEASE    . CERVICAL SPINE SURGERY    . ESOPHAGOGASTRECTOMY    . PROSTATE SURGERY  Current Medications: Current Meds  Medication Sig  . amiodarone (PACERONE) 200 MG tablet Take 200 mg by mouth in the morning and at bedtime.  Marland Kitchen amLODipine (NORVASC) 10 MG tablet Take 10 mg by mouth daily.  Marland Kitchen aspirin EC 81 MG tablet Take 81 mg by mouth daily.  . furosemide (LASIX) 40 MG tablet Take 40 mg by mouth daily.  . metFORMIN  (GLUCOPHAGE) 500 MG tablet Take 1 tablet by mouth daily with breakfast.   . methocarbamol (ROBAXIN) 500 MG tablet Take 500 mg by mouth 4 (four) times daily.  . metoprolol succinate (TOPROL-XL) 25 MG 24 hr tablet Take 25 mg by mouth daily.  Marland Kitchen omeprazole (PRILOSEC) 20 MG capsule Take 1 capsule by mouth daily.  . pravastatin (PRAVACHOL) 40 MG tablet Take 40 mg by mouth at bedtime.  . tamsulosin (FLOMAX) 0.4 MG CAPS capsule Take 0.4 mg by mouth in the morning and at bedtime.     Allergies:   Patient has no known allergies.   Social History   Socioeconomic History  . Marital status: Married    Spouse name: Not on file  . Number of children: Not on file  . Years of education: Not on file  . Highest education level: Not on file  Occupational History  . Not on file  Tobacco Use  . Smoking status: Former Games developer  . Smokeless tobacco: Never Used  Vaping Use  . Vaping Use: Never used  Substance and Sexual Activity  . Alcohol use: Not Currently  . Drug use: Not Currently  . Sexual activity: Not on file  Other Topics Concern  . Not on file  Social History Narrative  . Not on file   Social Determinants of Health   Financial Resource Strain:   . Difficulty of Paying Living Expenses: Not on file  Food Insecurity:   . Worried About Programme researcher, broadcasting/film/video in the Last Year: Not on file  . Ran Out of Food in the Last Year: Not on file  Transportation Needs:   . Lack of Transportation (Medical): Not on file  . Lack of Transportation (Non-Medical): Not on file  Physical Activity:   . Days of Exercise per Week: Not on file  . Minutes of Exercise per Session: Not on file  Stress:   . Feeling of Stress : Not on file  Social Connections:   . Frequency of Communication with Friends and Family: Not on file  . Frequency of Social Gatherings with Friends and Family: Not on file  . Attends Religious Services: Not on file  . Active Member of Clubs or Organizations: Not on file  . Attends Tax inspector Meetings: Not on file  . Marital Status: Not on file     Family History: The patient's family history includes Arthritis in his father and mother; Heart disease in his mother.  ROS:   Review of Systems  Constitution: Negative for decreased appetite, fever and weight gain.  HENT: Negative for congestion, ear discharge, hoarse voice and sore throat.   Eyes: Negative for discharge, redness, vision loss in right eye and visual halos.  Cardiovascular: Negative for chest pain, dyspnea on exertion, leg swelling, orthopnea and palpitations.  Respiratory: Negative for cough, hemoptysis, shortness of breath and snoring.   Endocrine: Negative for heat intolerance and polyphagia.  Hematologic/Lymphatic: Negative for bleeding problem. Does not bruise/bleed easily.  Skin: Negative for flushing, nail changes, rash and suspicious lesions.  Musculoskeletal: Negative for arthritis, joint pain, muscle cramps, myalgias, neck  pain and stiffness.  Gastrointestinal: Negative for abdominal pain, bowel incontinence, diarrhea and excessive appetite.  Genitourinary: Negative for decreased libido, genital sores and incomplete emptying.  Neurological: Negative for brief paralysis, focal weakness, headaches and loss of balance.  Psychiatric/Behavioral: Negative for altered mental status, depression and suicidal ideas.  Allergic/Immunologic: Negative for HIV exposure and persistent infections.    EKGs/Labs/Other Studies Reviewed:    The following studies were reviewed today:   EKG:  The ekg ordered today demonstrates sinus bradycardia, heart rate 52 bpm, compared to prior EKG in July 2021 no significant change.  Recent Labs: 12/08/2019: BUN 27; Creatinine, Ser 1.76; NT-Pro BNP 1,191; Potassium 4.1; Sodium 139  Recent Lipid Panel    Component Value Date/Time   CHOL 105 12/08/2019 1506   TRIG 87 12/08/2019 1506   HDL 39 (L) 12/08/2019 1506   CHOLHDL 2.7 12/08/2019 1506   LDLCALC 49 12/08/2019 1506     Physical Exam:    VS:  BP (!) 120/58 (BP Location: Right Arm, Patient Position: Sitting, Cuff Size: Normal)   Pulse (!) 55   Ht 5\' 7"  (1.702 m)   Wt 201 lb (91.2 kg)   SpO2 98%   BMI 31.48 kg/m     Wt Readings from Last 3 Encounters:  04/26/20 201 lb (91.2 kg)  03/30/20 199 lb 12.8 oz (90.6 kg)  12/08/19 204 lb (92.5 kg)     GEN: Well nourished, well developed in no acute distress HEENT: Normal NECK: No JVD; No carotid bruits LYMPHATICS: No lymphadenopathy CARDIAC: S1S2 noted,RRR, no murmurs, rubs, gallops RESPIRATORY:  Clear to auscultation without rales, wheezing or rhonchi  ABDOMEN: Soft, non-tender, non-distended, +bowel sounds, no guarding. EXTREMITIES: No edema, No cyanosis, no clubbing MUSCULOSKELETAL:  No deformity  SKIN: Warm and dry NEUROLOGIC:  Alert and oriented x 3, non-focal PSYCHIATRIC:  Normal affect, good insight  ASSESSMENT:    1. Paroxysmal atrial fibrillation (HCC)   2. Mixed dyslipidemia   3. Hypertensive heart disease with chronic diastolic congestive heart failure (HCC)   4. Stage 3a chronic kidney disease    PLAN:     He is in sinus rhythm today. He is doing better on amiodarone 200 mg daily. We will continue this for now. His blood pressure deceptively in the office. He is on aspirin 81 mg daily we will continue this for now. He has had history of bleeding in the past and is very apprehensive about anticoagulation and I do not believe this is unreasonable. Avoid nephrotoxins.  The patient is in agreement with the above plan. The patient left the office in stable condition.  The patient will follow up in 3 months with Dr. 02/07/20   Medication Adjustments/Labs and Tests Ordered: Current medicines are reviewed at length with the patient today.  Concerns regarding medicines are outlined above.  Orders Placed This Encounter  Procedures  . EKG 12-Lead   No orders of the defined types were placed in this encounter.   There are no Patient  Instructions on file for this visit.   Adopting a Healthy Lifestyle.  Know what a healthy weight is for you (roughly BMI <25) and aim to maintain this   Aim for 7+ servings of fruits and vegetables daily   65-80+ fluid ounces of water or unsweet tea for healthy kidneys   Limit to max 1 drink of alcohol per day; avoid smoking/tobacco   Limit animal fats in diet for cholesterol and heart health - choose grass fed whenever available   Avoid  highly processed foods, and foods high in saturated/trans fats   Aim for low stress - take time to unwind and care for your mental health   Aim for 150 min of moderate intensity exercise weekly for heart health, and weights twice weekly for bone health   Aim for 7-9 hours of sleep daily   When it comes to diets, agreement about the perfect plan isnt easy to find, even among the experts. Experts at the [facility] [employer] developed an idea known as the Healthy Eating Plate. Just imagine a plate divided into logical, healthy portions.   The emphasis is on diet quality:   Load up on vegetables and fruits - one-half of your plate: Aim for color and variety, and remember that potatoes dont count.   Go for whole grains - one-quarter of your plate: Whole wheat, barley, wheat berries, quinoa, oats, brown rice, and foods made with them. If you want pasta, go with whole wheat pasta.   Protein power - one-quarter of your plate: Fish, chicken, beans, and nuts are all healthy, versatile protein sources. Limit red meat.   The diet, however, does go beyond the plate, offering a few other suggestions.   Use healthy plant oils, such as olive, canola, soy, corn, sunflower and peanut. Check the labels, and avoid partially hydrogenated oil, which have unhealthy trans fats.   If youre thirsty, drink water. Coffee and tea are good in moderation, but skip sugary drinks and limit milk and dairy products to one or two daily servings.   The type of  carbohydrate in the diet is more important than the amount. Some sources of carbohydrates, such as vegetables, fruits, whole grains, and beans-are healthier than others.   Finally, stay active  Signed, Thomasene Ripple, DO  04/26/2020 3:04 PM    [facility]

## 2020-05-03 ENCOUNTER — Ambulatory Visit: Payer: Medicare Other | Admitting: Cardiology

## 2020-05-08 ENCOUNTER — Other Ambulatory Visit: Payer: Self-pay | Admitting: Cardiology

## 2020-05-08 ENCOUNTER — Telehealth: Payer: Self-pay | Admitting: Cardiology

## 2020-05-08 MED ORDER — AMIODARONE HCL 200 MG PO TABS
200.0000 mg | ORAL_TABLET | Freq: Two times a day (BID) | ORAL | 0 refills | Status: DC
Start: 2020-05-08 — End: 2020-05-08

## 2020-05-08 MED ORDER — AMIODARONE HCL 200 MG PO TABS: 200.0000 mg | ORAL_TABLET | Freq: Two times a day (BID) | ORAL | 3 refills | Status: DC

## 2020-05-08 MED ORDER — PRAVASTATIN SODIUM 40 MG PO TABS
40.0000 mg | ORAL_TABLET | Freq: Every day | ORAL | 3 refills | Status: DC
Start: 2020-05-08 — End: 2020-05-23

## 2020-05-08 MED ORDER — PRAVASTATIN SODIUM 40 MG PO TABS
40.0000 mg | ORAL_TABLET | Freq: Every day | ORAL | 0 refills | Status: DC
Start: 2020-05-08 — End: 2020-05-08

## 2020-05-08 MED ORDER — METOPROLOL SUCCINATE ER 25 MG PO TB24
25.0000 mg | ORAL_TABLET | Freq: Every day | ORAL | 3 refills | Status: DC
Start: 2020-05-08 — End: 2020-05-21

## 2020-05-08 MED ORDER — FUROSEMIDE 40 MG PO TABS
40.0000 mg | ORAL_TABLET | Freq: Every day | ORAL | 3 refills | Status: DC
Start: 2020-05-08 — End: 2020-05-21

## 2020-05-08 MED ORDER — FUROSEMIDE 40 MG PO TABS
40.0000 mg | ORAL_TABLET | Freq: Every day | ORAL | 0 refills | Status: DC
Start: 2020-05-08 — End: 2020-05-08

## 2020-05-08 MED ORDER — METOPROLOL SUCCINATE ER 25 MG PO TB24
25.0000 mg | ORAL_TABLET | Freq: Every day | ORAL | 0 refills | Status: DC
Start: 2020-05-08 — End: 2020-05-08

## 2020-05-08 MED ORDER — AMLODIPINE BESYLATE 10 MG PO TABS
10.0000 mg | ORAL_TABLET | Freq: Every day | ORAL | 3 refills | Status: DC
Start: 2020-05-08 — End: 2020-05-21

## 2020-05-08 MED ORDER — AMLODIPINE BESYLATE 10 MG PO TABS: 10.0000 mg | ORAL_TABLET | Freq: Every day | ORAL | 0 refills | Status: DC

## 2020-05-08 NOTE — Telephone Encounter (Signed)
Refills sent in per request to both [facility] and the mail order pharmacy.

## 2020-05-08 NOTE — Telephone Encounter (Signed)
Spoke to patients wife just now and she was just wanting to know the proper dosage and frequency of this medication. I went over the directions with her and she verbalizes understanding. She thanks me for the call back.    Encouraged patient to call back with any questions or concerns.

## 2020-05-08 NOTE — Telephone Encounter (Signed)
*  STAT* If patient is at the pharmacy, call can be transferred to refill team.   1. Which medications need to be refilled? (please list name of each medication and dose if known)  furosemide (LASIX) 40 MG tablet amiodarone (PACERONE) 200 MG tablet pravastatin (PRAVACHOL) 40 MG tablet  metoprolol succinate (TOPROL-XL) 25 MG 24 hr tablet 2. Which pharmacy/location (including street and city if local pharmacy) is medication to be sent to? [facility] - [place], [place] - [address]  3. Do they need a 30 day or 90 day supply? Needs enough to last until [facility] delivery. Please make sure 90 day supplies have be sent to mail service as well.   Completely out of medications.

## 2020-05-08 NOTE — Telephone Encounter (Signed)
Pt c/o medication issue:  1. Name of Medication:   amLODipine (NORVASC) 10 MG tablet   2. How are you currently taking this medication (dosage and times per day)? Hasn't been taking much of the medication   3. Are you having a reaction (difficulty breathing--STAT)? No   4. What is your medication issue? Kathie Rhodes is calling requesting to speak with a nurse in regards to this medication. She is requesting a callback before 11:00 AM, due to them leaving for the pharmacy around that time.

## 2020-05-21 ENCOUNTER — Other Ambulatory Visit: Payer: Self-pay

## 2020-05-21 MED ORDER — AMIODARONE HCL 200 MG PO TABS
200.0000 mg | ORAL_TABLET | Freq: Two times a day (BID) | ORAL | 2 refills | Status: DC
Start: 2020-05-21 — End: 2020-05-23

## 2020-05-21 MED ORDER — METOPROLOL SUCCINATE ER 25 MG PO TB24
25.0000 mg | ORAL_TABLET | Freq: Every day | ORAL | 2 refills | Status: DC
Start: 2020-05-21 — End: 2020-05-23

## 2020-05-21 MED ORDER — FUROSEMIDE 40 MG PO TABS
40.0000 mg | ORAL_TABLET | Freq: Every day | ORAL | 2 refills | Status: DC
Start: 2020-05-21 — End: 2020-05-23

## 2020-05-21 MED ORDER — AMLODIPINE BESYLATE 10 MG PO TABS
10.0000 mg | ORAL_TABLET | Freq: Every day | ORAL | 2 refills | Status: DC
Start: 2020-05-21 — End: 2020-05-23

## 2020-05-21 NOTE — Telephone Encounter (Signed)
Called [facility] since rx for Amlodipine was sent on 05-08-20, the pharmacist confirmed it this had not been filled yet.  I let her know I would be sending it again electronically.

## 2020-05-21 NOTE — Telephone Encounter (Signed)
Refills also sent to [facility] for Amiodarone, Metoprolol, and Furosemide.

## 2020-05-21 NOTE — Addendum Note (Signed)
Addended by: Anyah Swallow, Elmarie Shiley L on: 05/21/2020 01:02 PM   Modules accepted: Orders

## 2020-05-23 ENCOUNTER — Other Ambulatory Visit: Payer: Self-pay | Admitting: Cardiology

## 2020-05-23 MED ORDER — METOPROLOL SUCCINATE ER 25 MG PO TB24
25.0000 mg | ORAL_TABLET | Freq: Every day | ORAL | 0 refills | Status: DC
Start: 2020-05-23 — End: 2020-05-23

## 2020-05-23 MED ORDER — PRAVASTATIN SODIUM 40 MG PO TABS
40.0000 mg | ORAL_TABLET | Freq: Every day | ORAL | 1 refills | Status: DC
Start: 2020-05-23 — End: 2020-05-30

## 2020-05-23 MED ORDER — METOPROLOL SUCCINATE ER 25 MG PO TB24
25.0000 mg | ORAL_TABLET | Freq: Every day | ORAL | 1 refills | Status: DC
Start: 2020-05-23 — End: 2020-05-30

## 2020-05-23 MED ORDER — AMIODARONE HCL 200 MG PO TABS
200.0000 mg | ORAL_TABLET | Freq: Two times a day (BID) | ORAL | 0 refills | Status: DC
Start: 2020-05-23 — End: 2020-05-23

## 2020-05-23 MED ORDER — AMLODIPINE BESYLATE 10 MG PO TABS
10.0000 mg | ORAL_TABLET | Freq: Every day | ORAL | 1 refills | Status: DC
Start: 2020-05-23 — End: 2020-05-30

## 2020-05-23 MED ORDER — FUROSEMIDE 40 MG PO TABS
40.0000 mg | ORAL_TABLET | Freq: Every day | ORAL | 0 refills | Status: DC
Start: 2020-05-23 — End: 2020-05-23

## 2020-05-23 MED ORDER — AMLODIPINE BESYLATE 10 MG PO TABS
10.0000 mg | ORAL_TABLET | Freq: Every day | ORAL | 0 refills | Status: DC
Start: 2020-05-23 — End: 2020-05-23

## 2020-05-23 MED ORDER — FUROSEMIDE 40 MG PO TABS
40.0000 mg | ORAL_TABLET | Freq: Every day | ORAL | 1 refills | Status: DC
Start: 2020-05-23 — End: 2020-05-30

## 2020-05-23 MED ORDER — AMIODARONE HCL 200 MG PO TABS
200.0000 mg | ORAL_TABLET | Freq: Two times a day (BID) | ORAL | 1 refills | Status: DC
Start: 2020-05-23 — End: 2020-05-30

## 2020-05-23 MED ORDER — PRAVASTATIN SODIUM 40 MG PO TABS
40.0000 mg | ORAL_TABLET | Freq: Every day | ORAL | 0 refills | Status: DC
Start: 2020-05-23 — End: 2020-05-23

## 2020-05-23 NOTE — Telephone Encounter (Signed)
Medications filled at [facility] and mail in.

## 2020-05-23 NOTE — Telephone Encounter (Signed)
*  STAT* If patient is at the pharmacy, call can be transferred to refill team.   1. Which medications need to be refilled? (please list name of each medication and dose if known)  amLODipine (NORVASC) 10 MG tablet furosemide (LASIX) 40 MG tablet pravastatin (PRAVACHOL) 40 MG tablet amiodarone (PACERONE) 200 MG tablet metoprolol succinate (TOPROL-XL) 25 MG 24 hr tablet  2. Which pharmacy/location (including street and city if local pharmacy) is medication to be sent to?  [facility] (Retail in [place]) - [place], [place] - [address]  [facility] - [place], [place] - [address]  3. Do they need a 30 day or 90 day supply? 90 to mail order, 30 days to local [facility]   The patient is out of medication  The wife called the mail order pharmacy and the mail order pharmacy does not have new rx for the patient, so they are not able to send the patient his medication

## 2020-05-30 ENCOUNTER — Other Ambulatory Visit: Payer: Self-pay

## 2020-05-30 MED ORDER — AMLODIPINE BESYLATE 10 MG PO TABS
10.0000 mg | ORAL_TABLET | Freq: Every day | ORAL | 2 refills | Status: DC
Start: 2020-05-30 — End: 2022-09-04

## 2020-05-30 MED ORDER — AMIODARONE HCL 200 MG PO TABS
200.0000 mg | ORAL_TABLET | Freq: Two times a day (BID) | ORAL | 2 refills | Status: DC
Start: 2020-05-30 — End: 2020-10-09

## 2020-05-30 MED ORDER — METOPROLOL SUCCINATE ER 25 MG PO TB24
25.0000 mg | ORAL_TABLET | Freq: Every day | ORAL | 2 refills | Status: DC
Start: 2020-05-30 — End: 2020-07-30

## 2020-05-30 MED ORDER — PRAVASTATIN SODIUM 40 MG PO TABS
40.0000 mg | ORAL_TABLET | Freq: Every day | ORAL | 2 refills | Status: DC
Start: 2020-05-30 — End: 2020-12-18

## 2020-05-30 MED ORDER — FUROSEMIDE 40 MG PO TABS: 40.0000 mg | ORAL_TABLET | Freq: Every day | ORAL | 2 refills | Status: DC

## 2020-07-29 NOTE — Progress Notes (Signed)
Cardiology Office Note:    Date:  07/30/2020   ID:  Lawrence Myers, DOB [birth year], MRN [id number]  PCP:  Hadley Pen, MD  Cardiologist:  Norman Herrlich, MD    Referring MD: Hadley Pen, MD    ASSESSMENT:    1. Paroxysmal atrial fibrillation (HCC)   2. On amiodarone therapy   3. Hypertensive heart disease with chronic diastolic congestive heart failure (HCC)   4. Mixed dyslipidemia   5. Stage 3a chronic kidney disease (HCC)    PLAN:    In order of problems listed above:  1. Stable we will give him a note to be sure he is taking amiodarone once a day and fairly sure that is his dose I think we do best to stop his beta-blocker at this point time neurologic complication we will continue aspirin 2. Stable we will check labs today CMP and thyroids no evidence of toxicity 3. BP at target heart failure compensated continue his current loop diuretic 4. Stable continue statin check liver function lipids 5. Recheck kidney function   Next appointment: 3 months   Medication Adjustments/Labs and Tests Ordered: Current medicines are reviewed at length with the patient today.  Concerns regarding medicines are outlined above.  No orders of the defined types were placed in this encounter.  No orders of the defined types were placed in this encounter.   No chief complaint on file.   History of Present Illness:    Lawrence Myers is a 86 y.o. male with a hx of paroxysmal atrial fibrillation on amiodarone hypertensive heart disease with chronic diastolic heart failure and hyperlipidemia.  He has had a major hemorrhagic complication with anticoagulants in the past and he was last seen 04/26/2020 maintaining sinus rhythm.  His atrial fibrillation had demand ischemia elevated troponin and after extensive discussions with the patient wife and family in view of his comorbidities and frailty we did not pursue coronary angiography.  Compliance with diet, lifestyle and  medications: Yes  In general he is done well no palpitation edema shortness of breath chest pain palpitation or syncope.  Unfortunately has had a flulike illness has been seen by his PCP Covid negative left lung infiltrate and treated with antibiotics.  He is to Scopolamine patch for hiccups and had hallucinations. Past Medical History:  Diagnosis Date  . Atrial fibrillation with rapid ventricular response (HCC) 11/16/2017   Last Assessment & Plan:  Remains in sinus rhythm but his heart rate is around 100. Will add metoprolol 25 mg q.6 hours.  Continue to monitor.  As noted above recommend starting anticoagulant therapy when okay from a surgical stand point  . Bronchitis 10/20/2017  . Cervical stenosis of spinal canal 11/11/2017  . Chronic left shoulder pain 08/20/2017  . Chronic pain of right knee 08/20/2017  . Elevated troponin I level 11/16/2017   Last Assessment & Plan:  -given the lack of chest discomfort, this most likely represents type 2 NSTEMI, demand ischemia, in the setting of rapid atrial fibrillation -echocardiogram shows EF of 50-55% -no further workup  . Essential hypertension 02/04/2017   Last Assessment & Plan:  -home regimen includes lisinopril-hydrochlorothiazide -will hold this in the setting of elevated creatinine -is on metoprolol at this time in the setting of AFib, will continue -continue monitor blood pressure and make adjustments as necessary  . GERD without esophagitis 08/20/2017  . Hematoma of neck 11/19/2017  . Hyperlipidemia 11/16/2017   Last Assessment & Plan:  -continue simvastatin  . Hypokalemia  11/16/2017   Last Assessment & Plan:  Replete with IV fluids  . Impaired functional mobility, balance, gait, and endurance 12/11/2017  . Malaise and fatigue 08/20/2017  . Mixed dyslipidemia 02/04/2017  . Oropharyngeal dysphagia 12/11/2017  . Paroxysmal atrial fibrillation (HCC) 11/24/2017  . Productive cough 10/12/2017  . Stage 3 chronic kidney disease (HCC) 11/16/2017   Last  Assessment & Plan:  Review of care everywhere shows previous creatinines over the last 6 months between 1.4 and 1.6.  Gently hydrating repleting potassium  . Status post cervical spinal fusion 11/16/2017   Last Assessment & Plan:  -underwent this March 13th in [place]) -complicated by moderate hematoma left anterior neck -has mild sore throat but able to swallow and currently without respiratory distress -however patient does report that he feels his hematoma is slightly enlarged today -he had been started on anticoagulation in the setting of AFIB and thromboembolic risk  Plan...  . Tick bite 04/22/2017  . Tinea cruris 08/20/2017  . Type 2 diabetes mellitus without complication, without long-term current use of insulin (HCC) 08/20/2017   Last Assessment & Plan:  -currently on metformin; holding during admission -POC glucose -SSI -diabetic diet  . Vitamin D deficiency 08/20/2017    Past Surgical History:  Procedure Laterality Date  . CARPAL TUNNEL RELEASE    . CERVICAL SPINE SURGERY    . ESOPHAGOGASTRECTOMY    . PROSTATE SURGERY      Current Medications: Current Meds  Medication Sig  . amiodarone (PACERONE) 200 MG tablet Take 1 tablet (200 mg total) by mouth in the morning and at bedtime.  Marland Kitchen amLODipine (NORVASC) 10 MG tablet Take 1 tablet (10 mg total) by mouth daily.  Marland Kitchen aspirin EC 81 MG tablet Take 1 tablet (81 mg total) by mouth daily.  . furosemide (LASIX) 40 MG tablet Take 1 tablet (40 mg total) by mouth daily.  . metFORMIN (GLUCOPHAGE) 500 MG tablet Take 1 tablet by mouth daily with breakfast.   . methocarbamol (ROBAXIN) 500 MG tablet Take 500 mg by mouth 4 (four) times daily.  . metoprolol succinate (TOPROL-XL) 25 MG 24 hr tablet Take 1 tablet (25 mg total) by mouth daily.  Marland Kitchen omeprazole (PRILOSEC) 20 MG capsule Take 1 capsule by mouth daily.  . pravastatin (PRAVACHOL) 40 MG tablet Take 1 tablet (40 mg total) by mouth at bedtime.  . tamsulosin (FLOMAX) 0.4 MG CAPS  capsule Take 0.4 mg by mouth in the morning and at bedtime.     Allergies:   Patient has no known allergies.   Social History   Socioeconomic History  . Marital status: Married    Spouse name: Not on file  . Number of children: Not on file  . Years of education: Not on file  . Highest education level: Not on file  Occupational History  . Not on file  Tobacco Use  . Smoking status: Former Games developer  . Smokeless tobacco: Never Used  Vaping Use  . Vaping Use: Never used  Substance and Sexual Activity  . Alcohol use: Not Currently  . Drug use: Not Currently  . Sexual activity: Not on file  Other Topics Concern  . Not on file  Social History Narrative  . Not on file   Social Determinants of Health   Financial Resource Strain:   . Difficulty of Paying Living Expenses: Not on file  Food Insecurity:   . Worried About Programme researcher, broadcasting/film/video in the Last Year: Not on file  .  Ran Out of Food in the Last Year: Not on file  Transportation Needs:   . Lack of Transportation (Medical): Not on file  . Lack of Transportation (Non-Medical): Not on file  Physical Activity:   . Days of Exercise per Week: Not on file  . Minutes of Exercise per Session: Not on file  Stress:   . Feeling of Stress : Not on file  Social Connections:   . Frequency of Communication with Friends and Family: Not on file  . Frequency of Social Gatherings with Friends and Family: Not on file  . Attends Religious Services: Not on file  . Active Member of Clubs or Organizations: Not on file  . Attends Banker Meetings: Not on file  . Marital Status: Not on file     Family History: The patient's family history includes Arthritis in his father and mother; Heart disease in his mother. ROS:   Please see the history of present illness.    All other systems reviewed and are negative.  EKGs/Labs/Other Studies Reviewed:    The following studies were reviewed today:  EKG:  EKG ordered today and personally  reviewed.  The ekg ordered today demonstrates sinus bradycardia 48 bpm minutes normal QT interval  Recent Labs: 12/08/2019: BUN 27; Creatinine, Ser 1.76; NT-Pro BNP 1,191; Potassium 4.1; Sodium 139  Recent Lipid Panel    Component Value Date/Time   CHOL 105 12/08/2019 1506   TRIG 87 12/08/2019 1506   HDL 39 (L) 12/08/2019 1506   CHOLHDL 2.7 12/08/2019 1506   LDLCALC 49 12/08/2019 1506    Physical Exam:    VS:  BP (!) 108/58   Pulse (!) 48   Ht 5\' 7"  (1.702 m)   Wt 195 lb (88.5 kg)   SpO2 97%   BMI 30.54 kg/m     Wt Readings from Last 3 Encounters:  07/30/20 195 lb (88.5 kg)  04/26/20 201 lb (91.2 kg)  03/30/20 199 lb 12.8 oz (90.6 kg)     GEN:  Well nourished, well developed in no acute distress HEENT: Normal NECK: No JVD; No carotid bruits LYMPHATICS: No lymphadenopathy CARDIAC: RRR, no murmurs, rubs, gallops RESPIRATORY:  Clear to auscultation without rales, wheezing or rhonchi  ABDOMEN: Soft, non-tender, non-distended MUSCULOSKELETAL:  No edema; No deformity  SKIN: Warm and dry NEUROLOGIC:  Alert and oriented x 3 PSYCHIATRIC:  Normal affect    Signed, 04/01/20, MD  07/30/2020 10:07 AM    [facility]

## 2020-07-30 ENCOUNTER — Ambulatory Visit: Payer: Medicare Other | Admitting: Cardiology

## 2020-07-30 ENCOUNTER — Other Ambulatory Visit: Payer: Self-pay

## 2020-07-30 ENCOUNTER — Encounter: Payer: Self-pay | Admitting: Cardiology

## 2020-07-30 VITALS — BP 108/58 | HR 48 | Ht 67.0 in | Wt 195.0 lb

## 2020-07-30 DIAGNOSIS — I480 Paroxysmal atrial fibrillation: Secondary | ICD-10-CM

## 2020-07-30 DIAGNOSIS — I110 Hypertensive heart disease with heart failure: Secondary | ICD-10-CM | POA: Diagnosis not present

## 2020-07-30 DIAGNOSIS — Z79899 Other long term (current) drug therapy: Secondary | ICD-10-CM

## 2020-07-30 DIAGNOSIS — E782 Mixed hyperlipidemia: Secondary | ICD-10-CM

## 2020-07-30 DIAGNOSIS — N1831 Chronic kidney disease, stage 3a: Secondary | ICD-10-CM

## 2020-07-30 DIAGNOSIS — I5032 Chronic diastolic (congestive) heart failure: Secondary | ICD-10-CM

## 2020-07-30 NOTE — Patient Instructions (Signed)
Medication Instructions:  Your physician has recommended you make the following change in your medication:  STOP: TOPROL XL  Please take your amiodarone daily *If you need a refill on your cardiac medications before your next appointment, please call your pharmacy*   Lab Work: Your physician recommends that you return for lab work in: TODAY CMP, TSH, T3, T4, ProBNP If you have labs (blood work) drawn today and your tests are completely normal, you will receive your results only by: Marland Kitchen MyChart Message (if you have MyChart) OR . A paper copy in the mail If you have any lab test that is abnormal or we need to change your treatment, we will call you to review the results.   Testing/Procedures: None   Follow-Up: At [facility], you and your health needs are our priority.  As part of our continuing mission to provide you with exceptional heart care, we have created designated Provider Care Teams.  These Care Teams include your primary Cardiologist (physician) and Advanced Practice Providers (APPs -  Physician Assistants and Nurse Practitioners) who all work together to provide you with the care you need, when you need it.  We recommend signing up for the patient portal called "MyChart".  Sign up information is provided on this After Visit Summary.  MyChart is used to connect with patients for Virtual Visits (Telemedicine).  Patients are able to view lab/test results, encounter notes, upcoming appointments, etc.  Non-urgent messages can be sent to your provider as well.   To learn more about what you can do with MyChart, go to [email or web address].    Your next appointment:   3 month(s)  The format for your next appointment:   In Person  Provider:   Norman Herrlich, MD   Other Instructions

## 2020-07-30 NOTE — Addendum Note (Signed)
Addended by: Delorse Limber I on: 07/30/2020 10:15 AM   Modules accepted: Orders

## 2020-07-31 ENCOUNTER — Ambulatory Visit: Payer: Medicare Other | Admitting: Cardiology

## 2020-07-31 ENCOUNTER — Telehealth: Payer: Self-pay

## 2020-07-31 LAB — TSH+T4F+T3FREE
Free T4: 1.91 ng/dL — ABNORMAL HIGH (ref 0.82–1.77)
T3, Free: 2.1 pg/mL (ref 2.0–4.4)
TSH: 1.12 u[IU]/mL (ref 0.450–4.500)

## 2020-07-31 LAB — COMPREHENSIVE METABOLIC PANEL
ALT: 21 IU/L (ref 0–44)
AST: 21 IU/L (ref 0–40)
Albumin/Globulin Ratio: 1.5 (ref 1.2–2.2)
Albumin: 4 g/dL (ref 3.5–4.6)
Alkaline Phosphatase: 114 IU/L (ref 44–121)
BUN/Creatinine Ratio: 12 (ref 10–24)
BUN: 25 mg/dL (ref 10–36)
Bilirubin Total: 0.5 mg/dL (ref 0.0–1.2)
CO2: 25 mmol/L (ref 20–29)
Calcium: 10.1 mg/dL (ref 8.6–10.2)
Chloride: 101 mmol/L (ref 96–106)
Creatinine, Ser: 2.04 mg/dL — ABNORMAL HIGH (ref 0.76–1.27)
GFR calc Af Amer: 32 mL/min/{1.73_m2} — ABNORMAL LOW (ref >59)
GFR calc non Af Amer: 28 mL/min/{1.73_m2} — ABNORMAL LOW (ref >59)
Globulin, Total: 2.7 g/dL (ref 1.5–4.5)
Glucose: 84 mg/dL (ref 65–99)
Potassium: 4.1 mmol/L (ref 3.5–5.2)
Sodium: 141 mmol/L (ref 134–144)
Total Protein: 6.7 g/dL (ref 6.0–8.5)

## 2020-07-31 LAB — PRO B NATRIURETIC PEPTIDE: NT-Pro BNP: 581 pg/mL — ABNORMAL HIGH (ref 0–486)

## 2020-07-31 NOTE — Telephone Encounter (Signed)
Per Dr. Dulce Sellar:   Good result. Please reduce furosemide to 40 mg three times weekly.  Left message on patients voicemail to please return our call.

## 2020-07-31 NOTE — Telephone Encounter (Signed)
Spoke with patient regarding results and recommendation.  Patient verbalizes understanding and is agreeable to plan of care. Advised patient to call back with any issues or concerns.  

## 2020-09-20 DIAGNOSIS — R001 Bradycardia, unspecified: Secondary | ICD-10-CM

## 2020-09-20 HISTORY — DX: Bradycardia, unspecified: R00.1

## 2020-10-09 ENCOUNTER — Encounter: Payer: Self-pay | Admitting: Cardiology

## 2020-10-09 ENCOUNTER — Other Ambulatory Visit: Payer: Self-pay

## 2020-10-09 ENCOUNTER — Ambulatory Visit (INDEPENDENT_AMBULATORY_CARE_PROVIDER_SITE_OTHER): Payer: Medicare HMO | Admitting: Cardiology

## 2020-10-09 VITALS — BP 128/58 | HR 46 | Ht 67.0 in | Wt 202.0 lb

## 2020-10-09 DIAGNOSIS — I5032 Chronic diastolic (congestive) heart failure: Secondary | ICD-10-CM

## 2020-10-09 DIAGNOSIS — I10 Essential (primary) hypertension: Secondary | ICD-10-CM

## 2020-10-09 DIAGNOSIS — I110 Hypertensive heart disease with heart failure: Secondary | ICD-10-CM

## 2020-10-09 DIAGNOSIS — N1831 Chronic kidney disease, stage 3a: Secondary | ICD-10-CM

## 2020-10-09 DIAGNOSIS — I480 Paroxysmal atrial fibrillation: Secondary | ICD-10-CM | POA: Diagnosis not present

## 2020-10-09 DIAGNOSIS — Z79899 Other long term (current) drug therapy: Secondary | ICD-10-CM

## 2020-10-09 DIAGNOSIS — E782 Mixed hyperlipidemia: Secondary | ICD-10-CM

## 2020-10-09 MED ORDER — METOPROLOL SUCCINATE ER 25 MG PO TB24: 25.0000 mg | ORAL_TABLET | Freq: Every day | ORAL | 3 refills | Status: DC

## 2020-10-09 MED ORDER — AMIODARONE HCL 200 MG PO TABS: 200.0000 mg | ORAL_TABLET | Freq: Every day | ORAL | 3 refills | Status: DC

## 2020-10-09 NOTE — Progress Notes (Signed)
Cardiology Office Note:    Date:  10/09/2020   ID:  Lawrence Myers, DOB Jan 22, 1927, MRN [id number]  PCP:  Hadley Pen, MD  Cardiologist:  Norman Herrlich, MD    Referring MD: Hadley Pen, MD    ASSESSMENT:    1. Paroxysmal atrial fibrillation (HCC)   2. On amiodarone therapy   3. Hypertensive heart disease with chronic diastolic congestive heart failure (HCC)   4. Essential hypertension   5. Mixed dyslipidemia   6. Stage 3a chronic kidney disease (HCC)    PLAN:    In order of problems listed above:  1. Overall he is done well maintaining sinus rhythm he has mild bradycardia we will decrease his amiodarone decrease his beta-blocker hold beta-blocker rates less than 50 and assess thyroid function on amiodarone.  He is not anticoagulated and I would not raise the issue again. 2. His heart failure is compensated continue his current loop diuretic check renal function potassium 3. Stable continue low intensity statin 4. Recheck renal function   Next appointment: See both of them at 3 months at the same time.   Medication Adjustments/Labs and Tests Ordered: Current medicines are reviewed at length with the patient today.  Concerns regarding medicines are outlined above.  Orders Placed This Encounter  Procedures  . EKG 12-Lead   No orders of the defined types were placed in this encounter.   No chief complaint on file.   History of Present Illness:    Lawrence Myers is a 87 y.o. male with a hx of paroxysmal atrial fibrillation on amiodarone hypertensive heart disease with chronic diastolic heart failure hyperlipidemia bradycardia previous major hemorrhagic complication with anticoagulation and with rapid atrial fibrillation he had demand ischemia and elevated troponins.  He was last seen 07/30/2020.Marland Kitchen Compliance with diet, lifestyle and medications: Yes  His wife is a patient today at the end of her visit she told me that he needed to be seen because his  home heart rates are running as low as 40 bpm.  She hand carries prescription saying he is taking amiodarone 200 mg twice daily and Toprol-XL 50 mg/day.  Overall he is done well no chest pain shortness of breath palpitation or syncope he feels weak and tells me he has some hallucinations at nighttime saying his first wife is dead and his daughter who is died. Past Medical History:  Diagnosis Date  . Atrial fibrillation with rapid ventricular response (HCC) 11/16/2017   Last Assessment & Plan:  Remains in sinus rhythm but his heart rate is around 100. Will add metoprolol 25 mg q.6 hours.  Continue to monitor.  As noted above recommend starting anticoagulant therapy when okay from a surgical stand point  . Bronchitis 10/20/2017  . Cervical stenosis of spinal canal 11/11/2017  . Chronic left shoulder pain 08/20/2017  . Chronic pain of right knee 08/20/2017  . Elevated troponin I level 11/16/2017   Last Assessment & Plan:  -given the lack of chest discomfort, this most likely represents type 2 NSTEMI, demand ischemia, in the setting of rapid atrial fibrillation -echocardiogram shows EF of 50-55% -no further workup  . Essential hypertension 02/04/2017   Last Assessment & Plan:  -home regimen includes lisinopril-hydrochlorothiazide -will hold this in the setting of elevated creatinine -is on metoprolol at this time in the setting of AFib, will continue -continue monitor blood pressure and make adjustments as necessary  . GERD without esophagitis 08/20/2017  . Hematoma of neck 11/19/2017  . Hyperlipidemia 11/16/2017  Last Assessment & Plan:  -continue simvastatin  . Hypokalemia 11/16/2017   Last Assessment & Plan:  Replete with IV fluids  . Impaired functional mobility, balance, gait, and endurance 12/11/2017  . Malaise and fatigue 08/20/2017  . Mixed dyslipidemia 02/04/2017  . Oropharyngeal dysphagia 12/11/2017  . Paroxysmal atrial fibrillation (HCC) 11/24/2017  . Productive cough 10/12/2017  . Stage 3 chronic  kidney disease (HCC) 11/16/2017   Last Assessment & Plan:  Review of care everywhere shows previous creatinines over the last 6 months between 1.4 and 1.6.  Gently hydrating repleting potassium  . Status post cervical spinal fusion 11/16/2017   Last Assessment & Plan:  -underwent this March 13th in [place]) -complicated by moderate hematoma left anterior neck -has mild sore throat but able to swallow and currently without respiratory distress -however patient does report that he feels his hematoma is slightly enlarged today -he had been started on anticoagulation in the setting of AFIB and thromboembolic risk  Plan...  . Tick bite 04/22/2017  . Tinea cruris 08/20/2017  . Type 2 diabetes mellitus without complication, without long-term current use of insulin (HCC) 08/20/2017   Last Assessment & Plan:  -currently on metformin; holding during admission -POC glucose -SSI -diabetic diet  . Vitamin D deficiency 08/20/2017    Past Surgical History:  Procedure Laterality Date  . CARPAL TUNNEL RELEASE    . CERVICAL SPINE SURGERY    . ESOPHAGOGASTRECTOMY    . PROSTATE SURGERY      Current Medications: Current Meds  Medication Sig  . amiodarone (PACERONE) 200 MG tablet Take 1 tablet (200 mg total) by mouth in the morning and at bedtime.  Marland Kitchen amLODipine (NORVASC) 10 MG tablet Take 1 tablet (10 mg total) by mouth daily.  Marland Kitchen aspirin EC 81 MG tablet Take 1 tablet (81 mg total) by mouth daily.  . furosemide (LASIX) 40 MG tablet Take 1 tablet (40 mg total) by mouth daily.  . metFORMIN (GLUCOPHAGE) 500 MG tablet Take 1 tablet by mouth daily with breakfast.   . methocarbamol (ROBAXIN) 500 MG tablet Take 500 mg by mouth 4 (four) times daily.  Marland Kitchen omeprazole (PRILOSEC) 20 MG capsule Take 1 capsule by mouth daily.  . pravastatin (PRAVACHOL) 40 MG tablet Take 1 tablet (40 mg total) by mouth at bedtime.  . tamsulosin (FLOMAX) 0.4 MG CAPS capsule Take 0.4 mg by mouth in the morning and at bedtime.      Allergies:   Patient has no known allergies.   Social History   Socioeconomic History  . Marital status: Married    Spouse name: Not on file  . Number of children: Not on file  . Years of education: Not on file  . Highest education level: Not on file  Occupational History  . Not on file  Tobacco Use  . Smoking status: Former Games developer  . Smokeless tobacco: Never Used  Vaping Use  . Vaping Use: Never used  Substance and Sexual Activity  . Alcohol use: Not Currently  . Drug use: Not Currently  . Sexual activity: Not on file  Other Topics Concern  . Not on file  Social History Narrative  . Not on file   Social Determinants of Health   Financial Resource Strain: Not on file  Food Insecurity: Not on file  Transportation Needs: Not on file  Physical Activity: Not on file  Stress: Not on file  Social Connections: Not on file     Family History: The patient's family  history includes Arthritis in his father and mother; Heart disease in his mother. ROS:   Please see the history of present illness.    All other systems reviewed and are negative.  EKGs/Labs/Other Studies Reviewed:    The following studies were reviewed today:  EKG:  EKG ordered today and personally reviewed.  The ekg ordered today demonstrates sinus bradycardia beats per minute  Recent Labs: 07/30/2020: ALT 21; BUN 25; Creatinine, Ser 2.04; NT-Pro BNP 581; Potassium 4.1; Sodium 141; TSH 1.120  Recent Lipid Panel    Component Value Date/Time   CHOL 105 12/08/2019 1506   TRIG 87 12/08/2019 1506   HDL 39 (L) 12/08/2019 1506   CHOLHDL 2.7 12/08/2019 1506   LDLCALC 49 12/08/2019 1506    Physical Exam:    VS:  BP (!) 128/58   Pulse (!) 46   Ht 5\' 7"  (1.702 m)   Wt 202 lb (91.6 kg)   SpO2 98%   BMI 31.64 kg/m     Wt Readings from Last 3 Encounters:  10/09/20 202 lb (91.6 kg)  07/30/20 195 lb (88.5 kg)  04/26/20 201 lb (91.2 kg)     GEN: He looks his age well nourished, well developed in no  acute distress HEENT: Normal NECK: No JVD; No carotid bruits LYMPHATICS: No lymphadenopathy CARDIAC: RRR, no murmurs, rubs, gallops RESPIRATORY:  Clear to auscultation without rales, wheezing or rhonchi  ABDOMEN: Soft, non-tender, non-distended MUSCULOSKELETAL:  No edema; No deformity  SKIN: Warm and dry NEUROLOGIC:  Alert and oriented x 3 PSYCHIATRIC:  Normal affect    Signed, 04/28/20, MD  10/09/2020 3:11 PM    [facility]

## 2020-10-09 NOTE — Patient Instructions (Signed)
Medication Instructions:  Your physician has recommended you make the following change in your medication:  DECREASE: Amiodarone 200 mg take one tablet by mouth once daily.  DECREASE: TOPROL XL 25 mg take one tablet by mouth once daily and hold if your heart rate is less than 50 beats per minute.  *If you need a refill on your cardiac medications before your next appointment, please call your pharmacy*   Lab Work: Your physician recommends that you return for lab work in: TODAY CMP, TSH, T3, T4 If you have labs (blood work) drawn today and your tests are completely normal, you will receive your results only by: Marland Kitchen MyChart Message (if you have MyChart) OR . A paper copy in the mail If you have any lab test that is abnormal or we need to change your treatment, we will call you to review the results.   Testing/Procedures: None   Follow-Up: At [facility], you and your health needs are our priority.  As part of our continuing mission to provide you with exceptional heart care, we have created designated Provider Care Teams.  These Care Teams include your primary Cardiologist (physician) and Advanced Practice Providers (APPs -  Physician Assistants and Nurse Practitioners) who all work together to provide you with the care you need, when you need it.  We recommend signing up for the patient portal called "MyChart".  Sign up information is provided on this After Visit Summary.  MyChart is used to connect with patients for Virtual Visits (Telemedicine).  Patients are able to view lab/test results, encounter notes, upcoming appointments, etc.  Non-urgent messages can be sent to your provider as well.   To learn more about what you can do with MyChart, go to [email or web address].    Your next appointment:   3 month(s)  The format for your next appointment:   In Person  Provider:   Norman Herrlich, MD   Other Instructions

## 2020-10-10 ENCOUNTER — Telehealth: Payer: Self-pay

## 2020-10-10 LAB — COMPREHENSIVE METABOLIC PANEL
ALT: 29 IU/L (ref 0–44)
AST: 24 IU/L (ref 0–40)
Albumin/Globulin Ratio: 1.5 (ref 1.2–2.2)
Albumin: 4 g/dL (ref 3.5–4.6)
Alkaline Phosphatase: 116 IU/L (ref 44–121)
BUN/Creatinine Ratio: 16 (ref 10–24)
BUN: 30 mg/dL (ref 10–36)
Bilirubin Total: 0.4 mg/dL (ref 0.0–1.2)
CO2: 24 mmol/L (ref 20–29)
Calcium: 9.5 mg/dL (ref 8.6–10.2)
Chloride: 100 mmol/L (ref 96–106)
Creatinine, Ser: 1.9 mg/dL — ABNORMAL HIGH (ref 0.76–1.27)
GFR calc Af Amer: 35 mL/min/{1.73_m2} — ABNORMAL LOW (ref >59)
GFR calc non Af Amer: 30 mL/min/{1.73_m2} — ABNORMAL LOW (ref >59)
Globulin, Total: 2.7 g/dL (ref 1.5–4.5)
Glucose: 104 mg/dL — ABNORMAL HIGH (ref 65–99)
Potassium: 4.7 mmol/L (ref 3.5–5.2)
Sodium: 142 mmol/L (ref 134–144)
Total Protein: 6.7 g/dL (ref 6.0–8.5)

## 2020-10-10 LAB — TSH+T4F+T3FREE
Free T4: 2.07 ng/dL — ABNORMAL HIGH (ref 0.82–1.77)
T3, Free: 2 pg/mL (ref 2.0–4.4)
TSH: 1.3 u[IU]/mL (ref 0.450–4.500)

## 2020-10-10 NOTE — Telephone Encounter (Signed)
-----   Message from Baldo Daub, MD sent at 10/10/2020 12:49 PM EST ----- Normal results stable no change

## 2020-10-10 NOTE — Telephone Encounter (Signed)
-----   Message from Brian J Munley, MD sent at 10/10/2020 12:49 PM EST ----- Normal results stable no change 

## 2020-10-10 NOTE — Telephone Encounter (Signed)
Spoke with patient regarding results and recommendation.  Patient verbalizes understanding and is agreeable to plan of care. Advised patient to call back with any issues or concerns.  

## 2020-10-10 NOTE — Telephone Encounter (Signed)
Left a message to return my call.

## 2020-11-12 ENCOUNTER — Ambulatory Visit: Payer: Medicare Other | Admitting: Cardiology

## 2020-12-03 ENCOUNTER — Other Ambulatory Visit: Payer: Self-pay

## 2020-12-03 MED ORDER — AMIODARONE HCL 200 MG PO TABS: 200.0000 mg | ORAL_TABLET | Freq: Every day | ORAL | 3 refills | Status: DC

## 2020-12-03 NOTE — Telephone Encounter (Signed)
Amiodarone sent to [facility] per patient request

## 2020-12-18 ENCOUNTER — Other Ambulatory Visit: Payer: Self-pay

## 2020-12-18 MED ORDER — PRAVASTATIN SODIUM 40 MG PO TABS: 40.0000 mg | ORAL_TABLET | Freq: Every day | ORAL | 2 refills | Status: DC

## 2020-12-18 NOTE — Telephone Encounter (Signed)
Refill of Pravastatin 80 mg sent to [facility].

## 2021-01-04 ENCOUNTER — Other Ambulatory Visit: Payer: Self-pay

## 2021-01-04 MED ORDER — METOPROLOL SUCCINATE ER 25 MG PO TB24: 25.0000 mg | ORAL_TABLET | Freq: Every day | ORAL | 2 refills | Status: DC

## 2021-01-04 NOTE — Telephone Encounter (Signed)
Refill of Metoprolol Succinate 25 mg sent to [facility].

## 2021-01-16 ENCOUNTER — Ambulatory Visit: Payer: Medicare HMO | Admitting: Cardiology

## 2021-01-16 ENCOUNTER — Encounter: Payer: Self-pay | Admitting: Cardiology

## 2021-01-16 ENCOUNTER — Other Ambulatory Visit: Payer: Self-pay

## 2021-01-16 VITALS — BP 140/60 | HR 54 | Ht 67.0 in | Wt 194.0 lb

## 2021-01-16 DIAGNOSIS — N1831 Chronic kidney disease, stage 3a: Secondary | ICD-10-CM

## 2021-01-16 DIAGNOSIS — I110 Hypertensive heart disease with heart failure: Secondary | ICD-10-CM | POA: Diagnosis not present

## 2021-01-16 DIAGNOSIS — Z79899 Other long term (current) drug therapy: Secondary | ICD-10-CM | POA: Diagnosis not present

## 2021-01-16 DIAGNOSIS — I480 Paroxysmal atrial fibrillation: Secondary | ICD-10-CM

## 2021-01-16 DIAGNOSIS — I5032 Chronic diastolic (congestive) heart failure: Secondary | ICD-10-CM

## 2021-01-16 DIAGNOSIS — E782 Mixed hyperlipidemia: Secondary | ICD-10-CM

## 2021-01-16 NOTE — Addendum Note (Signed)
Addended by: Delorse Limber I on: 01/16/2021 01:02 PM   Modules accepted: Orders

## 2021-01-16 NOTE — Patient Instructions (Signed)
Medication Instructions:  Your physician recommends that you continue on your current medications as directed. Please refer to the Current Medication list given to you today.  *If you need a refill on your cardiac medications before your next appointment, please call your pharmacy*   Lab Work: Your physician recommends that you return for lab work in: TODAY CMP, TSH, T3, T4 If you have labs (blood work) drawn today and your tests are completely normal, you will receive your results only by: MyChart Message (if you have MyChart) OR A paper copy in the mail If you have any lab test that is abnormal or we need to change your treatment, we will call you to review the results.   Testing/Procedures: None   Follow-Up: At [facility], you and your health needs are our priority.  As part of our continuing mission to provide you with exceptional heart care, we have created designated Provider Care Teams.  These Care Teams include your primary Cardiologist (physician) and Advanced Practice Providers (APPs -  Physician Assistants and Nurse Practitioners) who all work together to provide you with the care you need, when you need it.  We recommend signing up for the patient portal called "MyChart".  Sign up information is provided on this After Visit Summary.  MyChart is used to connect with patients for Virtual Visits (Telemedicine).  Patients are able to view lab/test results, encounter notes, upcoming appointments, etc.  Non-urgent messages can be sent to your provider as well.   To learn more about what you can do with MyChart, go to [email or web address].    Your next appointment:   6 month(s)  The format for your next appointment:   In Person  Provider:   Brian Munley, MD   Other Instructions   

## 2021-01-16 NOTE — Progress Notes (Signed)
Cardiology Office Note:    Date:  01/16/2021   ID:  Lawrence Myers, DOB 1926-09-29, MRN [id number]  PCP:  Hadley Pen, MD  Cardiologist:  Norman Herrlich, MD    Referring MD: Hadley Pen, MD    ASSESSMENT:    1. Paroxysmal atrial fibrillation (HCC)   2. On amiodarone therapy   3. Hypertensive heart disease with chronic diastolic congestive heart failure (HCC)   4. Stage 3a chronic kidney disease (HCC)   5. Mixed dyslipidemia    PLAN:    In order of problems listed above:  1. Overall doing well maintaining sinus rhythm on low-dose amiodarone without toxicity I will recycle his labs today CMP and thyroids plan to see him in 1 months and he is purposely not anticoagulated major hemorrhagic complication in the past 2. Heart failure is compensated He has no edema is not having cardiovascular symptoms of shortness of breath continue his loop diuretic recheck renal function 3. Continue statin  Next appointment: 6 months   Medication Adjustments/Labs and Tests Ordered: Current medicines are reviewed at length with the patient today.  Concerns regarding medicines are outlined above.  No orders of the defined types were placed in this encounter.  No orders of the defined types were placed in this encounter.   Chief Complaint  Patient presents with  . Follow-up    History of Present Illness:    Lawrence Myers is a 87 y.o. male with a hx of paroxysmal atrial fibrillation on amiodarone hypertensive heart disease with chronic diastolic heart failure hyperlipidemia bradycardia previous major hemorrhagic complication with anticoagulation and with rapid atrial fibrillation he had demand ischemia and elevated troponins last seen 10/09/2020.  Compliance with diet, lifestyle and medications: Yes  Is doing well no trips to the hospital no episodes of rapid heart rhythm he is bothered by mobility and joint pain and taking Mobic. No angina shortness of breath edema  palpitation or syncope. Past Medical History:  Diagnosis Date  . Atrial fibrillation with rapid ventricular response (HCC) 11/16/2017   Last Assessment & Plan:  Remains in sinus rhythm but his heart rate is around 100. Will add metoprolol 25 mg q.6 hours.  Continue to monitor.  As noted above recommend starting anticoagulant therapy when okay from a surgical stand point  . Bronchitis 10/20/2017  . Cervical stenosis of spinal canal 11/11/2017  . Chronic left shoulder pain 08/20/2017  . Chronic pain of right knee 08/20/2017  . Elevated troponin I level 11/16/2017   Last Assessment & Plan:  -given the lack of chest discomfort, this most likely represents type 2 NSTEMI, demand ischemia, in the setting of rapid atrial fibrillation -echocardiogram shows EF of 50-55% -no further workup  . Essential hypertension 02/04/2017   Last Assessment & Plan:  -home regimen includes lisinopril-hydrochlorothiazide -will hold this in the setting of elevated creatinine -is on metoprolol at this time in the setting of AFib, will continue -continue monitor blood pressure and make adjustments as necessary  . GERD without esophagitis 08/20/2017  . Hematoma of neck 11/19/2017  . Hyperlipidemia 11/16/2017   Last Assessment & Plan:  -continue simvastatin  . Hypokalemia 11/16/2017   Last Assessment & Plan:  Replete with IV fluids  . Impaired functional mobility, balance, gait, and endurance 12/11/2017  . Malaise and fatigue 08/20/2017  . Mixed dyslipidemia 02/04/2017  . Oropharyngeal dysphagia 12/11/2017  . Paroxysmal atrial fibrillation (HCC) 11/24/2017  . Productive cough 10/12/2017  . Stage 3 chronic kidney disease (HCC) 11/16/2017  Last Assessment & Plan:  Review of care everywhere shows previous creatinines over the last 6 months between 1.4 and 1.6.  Gently hydrating repleting potassium  . Status post cervical spinal fusion 11/16/2017   Last Assessment & Plan:  -underwent this March 13th in [place]  -complicated by moderate hematoma left anterior neck -has mild sore throat but able to swallow and currently without respiratory distress -however patient does report that he feels his hematoma is slightly enlarged today -he had been started on anticoagulation in the setting of AFIB and thromboembolic risk  Plan...  . Tick bite 04/22/2017  . Tinea cruris 08/20/2017  . Type 2 diabetes mellitus without complication, without long-term current use of insulin (HCC) 08/20/2017   Last Assessment & Plan:  -currently on metformin; holding during admission -POC glucose -SSI -diabetic diet  . Vitamin D deficiency 08/20/2017    Past Surgical History:  Procedure Laterality Date  . CARPAL TUNNEL RELEASE    . CERVICAL SPINE SURGERY    . ESOPHAGOGASTRECTOMY    . PROSTATE SURGERY      Current Medications: Current Meds  Medication Sig  . amiodarone (PACERONE) 200 MG tablet Take 1 tablet (200 mg total) by mouth daily.  Marland Kitchen amLODipine (NORVASC) 10 MG tablet Take 1 tablet (10 mg total) by mouth daily.  Marland Kitchen aspirin EC 81 MG tablet Take 1 tablet (81 mg total) by mouth daily.  . furosemide (LASIX) 40 MG tablet Take 1 tablet (40 mg total) by mouth daily.  . meloxicam (MOBIC) 7.5 MG tablet Take 1 tablet by mouth daily.  . metFORMIN (GLUCOPHAGE) 500 MG tablet Take 1 tablet by mouth daily with breakfast.   . methocarbamol (ROBAXIN) 500 MG tablet Take 500 mg by mouth 4 (four) times daily.  . metoprolol succinate (TOPROL XL) 25 MG 24 hr tablet Take 1 tablet (25 mg total) by mouth daily.  Marland Kitchen omeprazole (PRILOSEC) 20 MG capsule Take 1 capsule by mouth daily.  . pravastatin (PRAVACHOL) 40 MG tablet Take 1 tablet (40 mg total) by mouth at bedtime.  . tamsulosin (FLOMAX) 0.4 MG CAPS capsule Take 0.4 mg by mouth in the morning and at bedtime.     Allergies:   Patient has no known allergies.   Social History   Socioeconomic History  . Marital status: Married    Spouse name: Not on file  . Number of children: Not on file   . Years of education: Not on file  . Highest education level: Not on file  Occupational History  . Not on file  Tobacco Use  . Smoking status: Former Games developer  . Smokeless tobacco: Never Used  Vaping Use  . Vaping Use: Never used  Substance and Sexual Activity  . Alcohol use: Not Currently  . Drug use: Not Currently  . Sexual activity: Not on file  Other Topics Concern  . Not on file  Social History Narrative  . Not on file   Social Determinants of Health   Financial Resource Strain: Not on file  Food Insecurity: Not on file  Transportation Needs: Not on file  Physical Activity: Not on file  Stress: Not on file  Social Connections: Not on file     Family History: The patient's family history includes Arthritis in his father and mother; Heart disease in his mother. ROS:   Please see the history of present illness.    All other systems reviewed and are negative.  EKGs/Labs/Other Studies Reviewed:    The following studies  were reviewed today:   Recent Labs: 07/30/2020: NT-Pro BNP 581 10/09/2020: ALT 29; BUN 30; Creatinine, Ser 1.90; Potassium 4.7; Sodium 142; TSH 1.300  Recent Lipid Panel    Component Value Date/Time   CHOL 105 12/08/2019 1506   TRIG 87 12/08/2019 1506   HDL 39 (L) 12/08/2019 1506   CHOLHDL 2.7 12/08/2019 1506   LDLCALC 49 12/08/2019 1506    Physical Exam:    VS:  BP 140/60   Pulse (!) 54   Ht 5\' 7"  (1.702 m)   Wt 194 lb (88 kg)   SpO2 97%   BMI 30.38 kg/m     Wt Readings from Last 3 Encounters:  01/16/21 194 lb (88 kg)  10/09/20 202 lb (91.6 kg)  07/30/20 195 lb (88.5 kg)     GEN: He looks his age well nourished, well developed in no acute distress HEENT: Normal NECK: No JVD; No carotid bruits LYMPHATICS: No lymphadenopathy CARDIAC: RRR, no murmurs, rubs, gallops RESPIRATORY:  Clear to auscultation without rales, wheezing or rhonchi  ABDOMEN: Soft, non-tender, non-distended MUSCULOSKELETAL:  No edema; No deformity  SKIN: Warm  and dry NEUROLOGIC:  Alert and oriented x 3 PSYCHIATRIC:  Normal affect    Signed, 08/01/20, MD  01/16/2021 12:50 PM     [facility]

## 2021-01-17 LAB — COMPREHENSIVE METABOLIC PANEL
ALT: 26 IU/L (ref 0–44)
AST: 25 IU/L (ref 0–40)
Albumin/Globulin Ratio: 1.4 (ref 1.2–2.2)
Albumin: 4.3 g/dL (ref 3.5–4.6)
Alkaline Phosphatase: 119 IU/L (ref 44–121)
BUN/Creatinine Ratio: 20 (ref 10–24)
BUN: 45 mg/dL — ABNORMAL HIGH (ref 10–36)
Bilirubin Total: 0.6 mg/dL (ref 0.0–1.2)
CO2: 23 mmol/L (ref 20–29)
Calcium: 10.1 mg/dL (ref 8.6–10.2)
Chloride: 99 mmol/L (ref 96–106)
Creatinine, Ser: 2.26 mg/dL — ABNORMAL HIGH (ref 0.76–1.27)
Globulin, Total: 3 g/dL (ref 1.5–4.5)
Glucose: 96 mg/dL (ref 65–99)
Potassium: 4.7 mmol/L (ref 3.5–5.2)
Sodium: 138 mmol/L (ref 134–144)
Total Protein: 7.3 g/dL (ref 6.0–8.5)
eGFR: 27 mL/min/{1.73_m2} — ABNORMAL LOW (ref >59)

## 2021-01-17 LAB — TSH+T4F+T3FREE
Free T4: 1.96 ng/dL — ABNORMAL HIGH (ref 0.82–1.77)
T3, Free: 1.5 pg/mL — ABNORMAL LOW (ref 2.0–4.4)
TSH: 0.462 u[IU]/mL (ref 0.450–4.500)

## 2021-03-11 ENCOUNTER — Other Ambulatory Visit: Payer: Self-pay

## 2021-03-11 MED ORDER — FUROSEMIDE 40 MG PO TABS: 40.0000 mg | ORAL_TABLET | Freq: Every day | ORAL | 2 refills | Status: DC

## 2021-03-11 NOTE — Telephone Encounter (Signed)
Refill of Furosemide 40 mg sent to [facility].

## 2021-07-15 ENCOUNTER — Other Ambulatory Visit: Payer: Self-pay

## 2021-07-15 ENCOUNTER — Ambulatory Visit: Payer: Medicare HMO | Admitting: Cardiology

## 2021-07-15 ENCOUNTER — Encounter: Payer: Self-pay | Admitting: Cardiology

## 2021-07-15 VITALS — BP 118/60 | HR 66 | Ht 67.0 in | Wt 200.6 lb

## 2021-07-15 DIAGNOSIS — I5032 Chronic diastolic (congestive) heart failure: Secondary | ICD-10-CM

## 2021-07-15 DIAGNOSIS — Z79899 Other long term (current) drug therapy: Secondary | ICD-10-CM | POA: Diagnosis not present

## 2021-07-15 DIAGNOSIS — I480 Paroxysmal atrial fibrillation: Secondary | ICD-10-CM | POA: Diagnosis not present

## 2021-07-15 DIAGNOSIS — N1831 Chronic kidney disease, stage 3a: Secondary | ICD-10-CM

## 2021-07-15 DIAGNOSIS — I110 Hypertensive heart disease with heart failure: Secondary | ICD-10-CM

## 2021-07-15 NOTE — Progress Notes (Signed)
Cardiology Office Note:    Date:  07/15/2021   ID:  Lawrence Myers, DOB 1927-04-07, MRN [id number]  PCP:  Hadley Pen, MD  Cardiologist:  Norman Herrlich, MD    Referring MD: Hadley Pen, MD    ASSESSMENT:    1. Paroxysmal atrial fibrillation (HCC)   2. On amiodarone therapy   3. Hypertensive heart disease with chronic diastolic congestive heart failure (HCC)   4. Stage 3a chronic kidney disease (HCC)    PLAN:    In order of problems listed above:  Overall he is done well no recurrent atrial fibrillation and tolerates low-dose amiodarone he has not anticoagulated for major hemorrhagic complication after spine surgery and continue low-dose aspirin.  Check liver function thyroid today for toxicity continue amiodarone Stable no evidence of heart failure continue his low-dose loop diuretic Stable CKD recheck renal function   Next appointment: 6 months   Medication Adjustments/Labs and Tests Ordered: Current medicines are reviewed at length with the patient today.  Concerns regarding medicines are outlined above.  Orders Placed This Encounter  Procedures   Comprehensive metabolic panel   [id number]   EKG 12-Lead   No orders of the defined types were placed in this encounter.   No chief complaint on file.  .[facility]  History of Present Illness:    Lawrence Myers is a 87 y.o. male with a hx of paroxysmal atrial fibrillation maintaining sinus rhythm on amiodarone hypertensive heart disease with chronic diastolic heart failure bradycardia hyperlipidemia previous major hemorrhagic complication with anticoagulation last seen 01/16/2021 maintaining sinus rhythm with amiodarone without toxicity. Compliance with diet, lifestyle and medications: Yes  He continues to do well he has had no recurrent episodes of rapid heart rhythm and screens his heart rate at home with peripheral devices no bradycardia or rapid heart rhythm and he has had no angina edema  shortness of breath palpitation or syncope.  He has not anticoagulation Past Medical History:  Diagnosis Date   Atrial fibrillation with rapid ventricular response (HCC) 11/16/2017   Last Assessment & Plan:  Remains in sinus rhythm but his heart rate is around 100. Will add metoprolol 25 mg q.6 hours.  Continue to monitor.  As noted above recommend starting anticoagulant therapy when okay from a surgical stand point   Bronchitis 10/20/2017   Cervical stenosis of spinal canal 11/11/2017   Chronic left shoulder pain 08/20/2017   Chronic pain of right knee 08/20/2017   Elevated troponin I level 11/16/2017   Last Assessment & Plan:  -given the lack of chest discomfort, this most likely represents type 2 NSTEMI, demand ischemia, in the setting of rapid atrial fibrillation -echocardiogram shows EF of 50-55% -no further workup   Essential hypertension 02/04/2017   Last Assessment & Plan:  -home regimen includes lisinopril-hydrochlorothiazide -will hold this in the setting of elevated creatinine -is on metoprolol at this time in the setting of AFib, will continue -continue monitor blood pressure and make adjustments as necessary   GERD without esophagitis 08/20/2017   Hematoma of neck 11/19/2017   Hyperlipidemia 11/16/2017   Last Assessment & Plan:  -continue simvastatin   Hypokalemia 11/16/2017   Last Assessment & Plan:  Replete with IV fluids   Impaired functional mobility, balance, gait, and endurance 12/11/2017   Malaise and fatigue 08/20/2017   Mixed dyslipidemia 02/04/2017   Oropharyngeal dysphagia 12/11/2017   Paroxysmal atrial fibrillation (HCC) 11/24/2017   Productive cough 10/12/2017   Stage 3 chronic kidney disease (HCC) 11/16/2017  Last Assessment & Plan:  Review of care everywhere shows previous creatinines over the last 6 months between 1.4 and 1.6.  Gently hydrating repleting potassium   Status post cervical spinal fusion 11/16/2017   Last Assessment & Plan:  -underwent this March 13th in [place] -complicated by moderate hematoma left anterior neck -has mild sore throat but able to swallow and currently without respiratory distress -however patient does report that he feels his hematoma is slightly enlarged today -he had been started on anticoagulation in the setting of AFIB and thromboembolic risk  Plan...   Tick bite 04/22/2017   Tinea cruris 08/20/2017   Type 2 diabetes mellitus without complication, without long-term current use of insulin (HCC) 08/20/2017   Last Assessment & Plan:  -currently on metformin; holding during admission -POC glucose -SSI -diabetic diet   Vitamin D deficiency 08/20/2017    Past Surgical History:  Procedure Laterality Date   CARPAL TUNNEL RELEASE     CERVICAL SPINE SURGERY     ESOPHAGOGASTRECTOMY     PROSTATE SURGERY      Current Medications: Current Meds  Medication Sig   amiodarone (PACERONE) 200 MG tablet Take 1 tablet (200 mg total) by mouth daily.   amLODipine (NORVASC) 10 MG tablet Take 1 tablet (10 mg total) by mouth daily.   aspirin EC 81 MG tablet Take 1 tablet (81 mg total) by mouth daily.   furosemide (LASIX) 40 MG tablet Take 1 tablet (40 mg total) by mouth daily.   meloxicam (MOBIC) 7.5 MG tablet Take 1 tablet by mouth daily.   methocarbamol (ROBAXIN) 500 MG tablet Take 500 mg by mouth 4 (four) times daily.   metoprolol succinate (TOPROL XL) 25 MG 24 hr tablet Take 1 tablet (25 mg total) by mouth daily.   omeprazole (PRILOSEC) 20 MG capsule Take 1 capsule by mouth daily.   pravastatin (PRAVACHOL) 40 MG tablet Take 1 tablet (40 mg total) by mouth at bedtime.     Allergies:   Patient has no known allergies.   Social History   Socioeconomic History   Marital status: Married    Spouse name: Not on file   Number of children: Not on file   Years of education: Not on file   Highest education level: Not on file  Occupational History   Not on file  Tobacco Use   Smoking status: Former   Smokeless tobacco:  Never  Vaping Use   Vaping Use: Never used  Substance and Sexual Activity   Alcohol use: Not Currently   Drug use: Not Currently   Sexual activity: Not on file  Other Topics Concern   Not on file  Social History Narrative   Not on file   Social Determinants of Health   Financial Resource Strain: Not on file  Food Insecurity: Not on file  Transportation Needs: Not on file  Physical Activity: Not on file  Stress: Not on file  Social Connections: Not on file     Family History: The patient's family history includes Arthritis in his father and mother; Heart disease in his mother. ROS:   Please see the history of present illness.    All other systems reviewed and are negative.  EKGs/Labs/Other Studies Reviewed:    The following studies were reviewed today:  EKG:  EKG ordered today and personally reviewed.  The ekg ordered today demonstrates sinus rhythm 50 bpm bradycardia otherwise normal EKG  Recent Labs: 07/30/2020: NT-Pro BNP 581 01/16/2021: ALT 26; BUN  45; Creatinine, Ser 2.26; Potassium 4.7; Sodium 138; TSH 0.462  Recent Lipid Panel    Component Value Date/Time   CHOL 105 12/08/2019 1506   TRIG 87 12/08/2019 1506   HDL 39 (L) 12/08/2019 1506   CHOLHDL 2.7 12/08/2019 1506   LDLCALC 49 12/08/2019 1506    Physical Exam:    VS:  BP 118/60 (BP Location: Right Arm, Patient Position: Sitting, Cuff Size: Normal)   Pulse 66   Ht 5\' 7"  (1.702 m)   Wt 200 lb 9.6 oz (91 kg)   SpO2 99%   BMI 31.42 kg/m     Wt Readings from Last 3 Encounters:  07/15/21 200 lb 9.6 oz (91 kg)  01/16/21 194 lb (88 kg)  10/09/20 202 lb (91.6 kg)     GEN: He appears his age well nourished, well developed in no acute distress HEENT: Normal NECK: No JVD; No carotid bruits LYMPHATICS: No lymphadenopathy CARDIAC: RRR, no murmurs, rubs, gallops RESPIRATORY:  Clear to auscultation without rales, wheezing or rhonchi  ABDOMEN: Soft, non-tender, non-distended MUSCULOSKELETAL:  No edema; No  deformity  SKIN: Warm and dry NEUROLOGIC:  Alert and oriented x 3 PSYCHIATRIC:  Normal affect    Signed, 12/07/20, MD  07/15/2021 2:14 PM    [facility]

## 2021-07-15 NOTE — Patient Instructions (Signed)
Medication Instructions:  Your physician recommends that you continue on your current medications as directed. Please refer to the Current Medication list given to you today.  *If you need a refill on your cardiac medications before your next appointment, please call your pharmacy*   Lab Work: Your physician recommends that you return for lab work in: TODAY CMP, TSH, T3, T4 If you have labs (blood work) drawn today and your tests are completely normal, you will receive your results only by: MyChart Message (if you have MyChart) OR A paper copy in the mail If you have any lab test that is abnormal or we need to change your treatment, we will call you to review the results.   Testing/Procedures: None   Follow-Up: At [facility], you and your health needs are our priority.  As part of our continuing mission to provide you with exceptional heart care, we have created designated Provider Care Teams.  These Care Teams include your primary Cardiologist (physician) and Advanced Practice Providers (APPs -  Physician Assistants and Nurse Practitioners) who all work together to provide you with the care you need, when you need it.  We recommend signing up for the patient portal called "MyChart".  Sign up information is provided on this After Visit Summary.  MyChart is used to connect with patients for Virtual Visits (Telemedicine).  Patients are able to view lab/test results, encounter notes, upcoming appointments, etc.  Non-urgent messages can be sent to your provider as well.   To learn more about what you can do with MyChart, go to [email or web address].    Your next appointment:   6 month(s)  The format for your next appointment:   In Person  Provider:   Brian Munley, MD   Other Instructions   

## 2021-07-16 ENCOUNTER — Telehealth: Payer: Self-pay

## 2021-07-16 LAB — COMPREHENSIVE METABOLIC PANEL
ALT: 15 IU/L (ref 0–44)
AST: 23 IU/L (ref 0–40)
Albumin/Globulin Ratio: 1.4 (ref 1.2–2.2)
Albumin: 4.2 g/dL (ref 3.5–4.6)
Alkaline Phosphatase: 115 IU/L (ref 44–121)
BUN/Creatinine Ratio: 21 (ref 10–24)
BUN: 43 mg/dL — ABNORMAL HIGH (ref 10–36)
Bilirubin Total: 0.4 mg/dL (ref 0.0–1.2)
CO2: 26 mmol/L (ref 20–29)
Calcium: 9.5 mg/dL (ref 8.6–10.2)
Chloride: 100 mmol/L (ref 96–106)
Creatinine, Ser: 2.08 mg/dL — ABNORMAL HIGH (ref 0.76–1.27)
Globulin, Total: 2.9 g/dL (ref 1.5–4.5)
Glucose: 91 mg/dL (ref 70–99)
Potassium: 4.1 mmol/L (ref 3.5–5.2)
Sodium: 141 mmol/L (ref 134–144)
Total Protein: 7.1 g/dL (ref 6.0–8.5)
eGFR: 29 mL/min/{1.73_m2} — ABNORMAL LOW (ref >59)

## 2021-07-16 LAB — TSH+T4F+T3FREE
Free T4: 1.75 ng/dL (ref 0.82–1.77)
T3, Free: 2 pg/mL (ref 2.0–4.4)
TSH: 0.536 u[IU]/mL (ref 0.450–4.500)

## 2021-07-16 NOTE — Telephone Encounter (Signed)
Spoke with patient regarding results and recommendation.  Patient verbalizes understanding and is agreeable to plan of care. Advised patient to call back with any issues or concerns.  

## 2021-07-16 NOTE — Telephone Encounter (Signed)
-----   Message from Baldo Daub, MD sent at 07/16/2021  9:44 AM EST ----- Normal or stable result

## 2021-08-09 ENCOUNTER — Other Ambulatory Visit: Payer: Self-pay | Admitting: Cardiology

## 2021-08-17 ENCOUNTER — Other Ambulatory Visit: Payer: Self-pay | Admitting: Cardiology

## 2021-12-05 ENCOUNTER — Ambulatory Visit: Payer: Medicare HMO | Admitting: Gastroenterology

## 2021-12-24 ENCOUNTER — Other Ambulatory Visit: Payer: Self-pay | Admitting: Cardiology

## 2022-01-06 DIAGNOSIS — Z860100 Personal history of colon polyps, unspecified: Secondary | ICD-10-CM | POA: Insufficient documentation

## 2022-01-06 DIAGNOSIS — R066 Hiccough: Secondary | ICD-10-CM | POA: Insufficient documentation

## 2022-01-11 NOTE — Progress Notes (Signed)
?Cardiology Office Note:   ? ?Date:  01/14/2022  ? ?ID:  Lawrence Myers, DOB [birth year], MRN [id number] ? ?PCP:  Hadley Pen, MD  ?Cardiologist:  Norman Herrlich, MD   ? ?Referring MD: Hadley Pen, MD  ? ? ?ASSESSMENT:   ? ?1. Paroxysmal atrial fibrillation (HCC)   ?2. On amiodarone therapy   ?3. Hypertensive heart disease with chronic diastolic congestive heart failure (HCC)   ?4. Stage 3a chronic kidney disease (HCC)   ?5. Mixed dyslipidemia   ? ?PLAN:   ? ?In order of problems listed above: ? ?Further continues to do well and maintain sinus rhythm on low-dose amiodarone without toxicity he is not anticoagulated with previous severe hemorrhagic complication and will continue low-dose aspirin for cardiovascular prophylaxis.  Today check thyroid studies and CMP for toxicity ?Stable heart failure is compensated continue his current diuretic recheck his renal function BP is at target without other antihypertensives other than low-dose beta-blocker ?Continue statin check lipid profile ? ? ?Next appointment: 6 months ? ? ?Medication Adjustments/Labs and Tests Ordered: ?Current medicines are reviewed at length with the patient today.  Concerns regarding medicines are outlined above.  ?No orders of the defined types were placed in this encounter. ? ?No orders of the defined types were placed in this encounter. ? ? ?Complaint follow-up for atrial fibrillation and heart failure on amiodarone ? ? ?History of Present Illness:   ? ?Lawrence Myers is a 88 y.o. male with a hx of paroxysmal atrial fibrillation maintaining sinus rhythm on low dose amiodarone hypertensive heart disease with chronic diastolic heart failure bradycardia hyperlipidemia previous major hemorrhagic complication with anticoagulation  last seen 07/15/2021 ? ?Compliance with diet, lifestyle and medications: Yes ? ?Both Dredyn and his wife continue to do remarkably well they live independently ?He has had no emergency room visits or  hospitalizations ?He continues in sinus rhythm no episodes of atrial fibrillation he has had no chest pain edema shortness of breath palpitation or syncope ?He is purposely not anticoagulated. ? ?Recent labs [facility] 11/18/2021: ?CMP normal except for creatinine 1.90 GFR 32 cc somewhat improved from previous 2.05 potassium normal 4.1 and normal liver function test ?TSH last summer 2007 18 2022 decreased 0.34 ?Hemoglobin 12.0 platelets 235,000. ?Past Medical History:  ?Diagnosis Date  ? Atrial fibrillation with rapid ventricular response (HCC) 11/16/2017  ? Last Assessment & Plan:  Remains in sinus rhythm but his heart rate is around 100. Will add metoprolol 25 mg q.6 hours.  Continue to monitor.  As noted above recommend starting anticoagulant therapy when okay from a surgical stand point  ? Bronchitis 10/20/2017  ? Cervical stenosis of spinal canal 11/11/2017  ? Chronic left shoulder pain 08/20/2017  ? Chronic pain of right knee 08/20/2017  ? Elevated troponin I level 11/16/2017  ? Last Assessment & Plan:  -given the lack of chest discomfort, this most likely represents type 2 NSTEMI, demand ischemia, in the setting of rapid atrial fibrillation -echocardiogram shows EF of 50-55% -no further workup  ? Essential hypertension 02/04/2017  ? Last Assessment & Plan:  -home regimen includes lisinopril-hydrochlorothiazide -will hold this in the setting of elevated creatinine -is on metoprolol at this time in the setting of AFib, will continue -continue monitor blood pressure and make adjustments as necessary  ? GERD without esophagitis 08/20/2017  ? Hematoma of neck 11/19/2017  ? History of colon polyps   ? Hyperlipidemia 11/16/2017  ? Last Assessment & Plan:  -continue simvastatin  ?  Hypokalemia 11/16/2017  ? Last Assessment & Plan:  Replete with IV fluids  ? Impaired functional mobility, balance, gait, and endurance 12/11/2017  ? Intractable hiccups   ? Malaise and fatigue 08/20/2017  ? Mixed dyslipidemia  02/04/2017  ? Oropharyngeal dysphagia 12/11/2017  ? Paroxysmal atrial fibrillation (HCC) 11/24/2017  ? Productive cough 10/12/2017  ? Stage 3 chronic kidney disease (HCC) 11/16/2017  ? Last Assessment & Plan:  Review of care everywhere shows previous creatinines over the last 6 months between 1.4 and 1.6.  Gently hydrating repleting potassium  ? Status post cervical spinal fusion 11/16/2017  ? Last Assessment & Plan:  -underwent this March 13th in [facility]) -complicated by moderate hematoma left anterior neck -has mild sore throat but able to swallow and currently without respiratory distress -however patient does report that he feels his hematoma is slightly enlarged today -he had been started on anticoagulation in the setting of AFIB and thromboembolic risk  Plan...  ? Tick bite 04/22/2017  ? Tinea cruris 08/20/2017  ? Type 2 diabetes mellitus without complication, without long-term current use of insulin (HCC) 08/20/2017  ? Last Assessment & Plan:  -currently on metformin; holding during admission -POC glucose -SSI -diabetic diet  ? Vitamin D deficiency 08/20/2017  ? ? ?Past Surgical History:  ?Procedure Laterality Date  ? CARPAL TUNNEL RELEASE    ? CERVICAL SPINE SURGERY    ? COLONOSCOPY  04/21/2007  ? Small colonic polyp status post polypectomy. Pancolonic diverticulosis predominantly in the left colon. Small internal hemorrhoids.  ? ESOPHAGOGASTRECTOMY  04/28/2016  ? Schatzki's ring status post esophageal dilitation. Small hiatal hernia. Gastric ulcers(biopsied) Presbyesophagus with suggestion of esophageal dysmotility.  ? PROSTATE SURGERY    ? ? ?Current Medications: ?Current Meds  ?Medication Sig  ? amiodarone (PACERONE) 200 MG tablet Take 1 tablet (200 mg total) by mouth daily.  ? amLODipine (NORVASC) 10 MG tablet Take 1 tablet (10 mg total) by mouth daily.  ? aspirin EC 81 MG tablet Take 1 tablet (81 mg total) by mouth daily.  ? furosemide (LASIX) 40 MG tablet Take 1 tablet (40 mg total)  by mouth daily.  ? meloxicam (MOBIC) 7.5 MG tablet Take 1 tablet by mouth daily.  ? metoprolol succinate (TOPROL-XL) 25 MG 24 hr tablet TAKE 1 TABLET (25 MG TOTAL) BY MOUTH DAILY.  ? omeprazole (PRILOSEC) 20 MG capsule Take 1 capsule by mouth daily.  ? pravastatin (PRAVACHOL) 40 MG tablet TAKE 1 TABLET AT BEDTIME  ? tamsulosin (FLOMAX) 0.4 MG CAPS capsule Take 0.4 mg by mouth daily after breakfast.  ?  ? ?Allergies:   Patient has no known allergies.  ? ?Social History  ? ?Socioeconomic History  ? Marital status: Married  ?  Spouse name: Not on file  ? Number of children: Not on file  ? Years of education: Not on file  ? Highest education level: Not on file  ?Occupational History  ? Not on file  ?Tobacco Use  ? Smoking status: Former  ?  Passive exposure: Past  ? Smokeless tobacco: Never  ?Vaping Use  ? Vaping Use: Never used  ?Substance and Sexual Activity  ? Alcohol use: Not Currently  ? Drug use: Not Currently  ? Sexual activity: Not on file  ?Other Topics Concern  ? Not on file  ?Social History Narrative  ? Not on file  ? ?Social Determinants of Health  ? ?Financial Resource Strain: Not on file  ?Food Insecurity: Not on file  ?Transportation Needs:  Not on file  ?Physical Activity: Not on file  ?Stress: Not on file  ?Social Connections: Not on file  ?  ? ?Family History: ?The patient's family history includes Arthritis in his father and mother; Heart disease in his mother. ?ROS:   ?Please see the history of present illness.    ?All other systems reviewed and are negative. ? ?EKGs/Labs/Other Studies Reviewed:   ? ?The following studies were reviewed today: ? ?EKG:  EKG ordered today and personally reviewed.  The ekg ordered today demonstrates sinus bradycardia 58 bpm otherwise normal EKG ? ?Recent Labs: ?07/15/2021: ALT 15; BUN 43; Creatinine, Ser 2.08; Potassium 4.1; Sodium 141; TSH 0.536  ?Recent Lipid Panel ?   ?Component Value Date/Time  ? CHOL 105 12/08/2019 1506  ? TRIG 87 12/08/2019 1506  ? HDL 39 (L)  12/08/2019 1506  ? CHOLHDL 2.7 12/08/2019 1506  ? LDLCALC 49 12/08/2019 1506  ? ? ?Physical Exam:   ? ?VS:  BP (!) 122/56 (BP Location: Left Arm, Patient Position: Sitting)   Pulse (!) 58    ? ?Wt Readings from Last

## 2022-01-14 ENCOUNTER — Ambulatory Visit: Payer: Medicare HMO | Admitting: Cardiology

## 2022-01-14 ENCOUNTER — Encounter: Payer: Self-pay | Admitting: Cardiology

## 2022-01-14 VITALS — BP 122/56 | HR 58

## 2022-01-14 DIAGNOSIS — I480 Paroxysmal atrial fibrillation: Secondary | ICD-10-CM | POA: Diagnosis not present

## 2022-01-14 DIAGNOSIS — Z79899 Other long term (current) drug therapy: Secondary | ICD-10-CM

## 2022-01-14 DIAGNOSIS — E782 Mixed hyperlipidemia: Secondary | ICD-10-CM

## 2022-01-14 DIAGNOSIS — I5032 Chronic diastolic (congestive) heart failure: Secondary | ICD-10-CM

## 2022-01-14 DIAGNOSIS — N1831 Chronic kidney disease, stage 3a: Secondary | ICD-10-CM | POA: Diagnosis not present

## 2022-01-14 DIAGNOSIS — I110 Hypertensive heart disease with heart failure: Secondary | ICD-10-CM | POA: Diagnosis not present

## 2022-01-14 NOTE — Patient Instructions (Signed)
Medication Instructions:  ?Your physician recommends that you continue on your current medications as directed. Please refer to the Current Medication list given to you today. ? ?*If you need a refill on your cardiac medications before your next appointment, please call your pharmacy* ? ? ?Lab Work: ?Your physician recommends that you return for lab work Today for a CMP, TSH, Free T3 & T4, and a Lipid Panel ? ?If you have labs (blood work) drawn today and your tests are completely normal, you will receive your results only by: ?MyChart Message (if you have MyChart) OR ?A paper copy in the mail ?If you have any lab test that is abnormal or we need to change your treatment, we will call you to review the results. ? ? ?Testing/Procedures: ?NONE ? ? ?Follow-Up: ?At [facility], you and your health needs are our priority.  As part of our continuing mission to provide you with exceptional heart care, we have created designated Provider Care Teams.  These Care Teams include your primary Cardiologist (physician) and Advanced Practice Providers (APPs -  Physician Assistants and Nurse Practitioners) who all work together to provide you with the care you need, when you need it. ? ?We recommend signing up for the patient portal called "MyChart".  Sign up information is provided on this After Visit Summary.  MyChart is used to connect with patients for Virtual Visits (Telemedicine).  Patients are able to view lab/test results, encounter notes, upcoming appointments, etc.  Non-urgent messages can be sent to your provider as well.   ?To learn more about what you can do with MyChart, go to [email or web address].   ? ?Your next appointment:   ?6 month(s) ? ?The format for your next appointment:   ?In Person ? ?Provider:   ?Shirlee More, MD  ? ? ?Other Instructions ? ? ?Important Information About Sugar ? ? ? ? ?  ?

## 2022-01-14 NOTE — Addendum Note (Signed)
Addended by: Jerl Santos R on: 01/14/2022 11:38 AM ? ? Modules accepted: Orders ? ?

## 2022-01-15 ENCOUNTER — Telehealth: Payer: Self-pay

## 2022-01-15 DIAGNOSIS — I10 Essential (primary) hypertension: Secondary | ICD-10-CM

## 2022-01-15 LAB — LIPID PANEL
Chol/HDL Ratio: 3.1 ratio (ref 0.0–5.0)
Cholesterol, Total: 120 mg/dL (ref 100–199)
HDL: 39 mg/dL — ABNORMAL LOW (ref >39)
LDL Chol Calc (NIH): 63 mg/dL (ref 0–99)
Triglycerides: 93 mg/dL (ref 0–149)
VLDL Cholesterol Cal: 18 mg/dL (ref 5–40)

## 2022-01-15 LAB — TSH: TSH: 0.573 u[IU]/mL (ref 0.450–4.500)

## 2022-01-15 LAB — COMPREHENSIVE METABOLIC PANEL
ALT: 13 IU/L (ref 0–44)
AST: 19 IU/L (ref 0–40)
Albumin/Globulin Ratio: 1.7 (ref 1.2–2.2)
Albumin: 4 g/dL (ref 3.5–4.6)
Alkaline Phosphatase: 113 IU/L (ref 44–121)
BUN/Creatinine Ratio: 15 (ref 10–24)
BUN: 32 mg/dL (ref 10–36)
Bilirubin Total: 0.4 mg/dL (ref 0.0–1.2)
CO2: 24 mmol/L (ref 20–29)
Calcium: 9.4 mg/dL (ref 8.6–10.2)
Chloride: 104 mmol/L (ref 96–106)
Creatinine, Ser: 2.17 mg/dL — ABNORMAL HIGH (ref 0.76–1.27)
Globulin, Total: 2.3 g/dL (ref 1.5–4.5)
Glucose: 95 mg/dL (ref 70–99)
Potassium: 4.3 mmol/L (ref 3.5–5.2)
Sodium: 143 mmol/L (ref 134–144)
Total Protein: 6.3 g/dL (ref 6.0–8.5)
eGFR: 28 mL/min/{1.73_m2} — ABNORMAL LOW (ref >59)

## 2022-01-15 LAB — T4, FREE: Free T4: 1.65 ng/dL (ref 0.82–1.77)

## 2022-01-15 LAB — T3, FREE: T3, Free: 2.1 pg/mL (ref 2.0–4.4)

## 2022-01-15 NOTE — Telephone Encounter (Signed)
-----   Message from Baldo Daub, MD sent at 01/15/2022  8:20 AM EDT ----- ?Stable good results  ?No changes ?Recheck BMP 3 mos ?

## 2022-01-15 NOTE — Telephone Encounter (Signed)
Jasmine December notified of results ?

## 2022-06-04 ENCOUNTER — Other Ambulatory Visit: Payer: Self-pay | Admitting: Cardiology

## 2022-06-04 NOTE — Telephone Encounter (Signed)
Refill to pharmacy 

## 2022-08-11 ENCOUNTER — Other Ambulatory Visit: Payer: Self-pay | Admitting: Cardiology

## 2022-08-11 NOTE — Telephone Encounter (Signed)
Rx refill sent to pharmacy. 

## 2022-08-14 ENCOUNTER — Other Ambulatory Visit: Payer: Self-pay

## 2022-09-03 NOTE — Progress Notes (Unsigned)
Cardiology Office Note:    Date:  09/04/2022   ID:  Lawrence Myers, DOB [birth year], MRN [id number]  PCP:  Lawrence Broker, MD  Cardiologist:  Lawrence More, MD    Referring MD: Lawrence Broker, MD    ASSESSMENT:    1. Paroxysmal atrial fibrillation (HCC)   2. On amiodarone therapy   3. Hypertensive heart disease with chronic diastolic congestive heart failure ([place])   4. CKD (chronic kidney disease) stage 4, GFR 15-29 ml/min (HCC)   5. Mixed dyslipidemia    PLAN:    In order of problems listed above:  He continues to do well with atrial fibrillation maintaining sinus rhythm on low-dose amiodarone without toxicity we will recheck his labs including liver thyroid continue his current dosage and aspirin for general cardiovascular prophylaxis and not anticoagulated because of previous major hemorrhagic complication Heart failure is nicely compensated he has no fluid overload continue his current loop diuretic recheck renal function with stage IV CKD and continue his minimal dose of beta-blocker. Continue his current statin Was completed for handicap drivers license placard   Next appointment: 6 months   Medication Adjustments/Labs and Tests Ordered: Current medicines are reviewed at length with the patient today.  Concerns regarding medicines are outlined above.  Orders Placed This Encounter  Procedures   TSH+T4F+T3Free   Comp Met (CMET)   EKG 12-Lead   No orders of the defined types were placed in this encounter.   Chief complaint follow-up for atrial fibrillation   History of Present Illness:    Lawrence Myers is a 89 y.o. male with a hx of paroxysmal atrial fibrillation maintaining sinus rhythm with low-dose amiodarone he is not anticoagulated because of previous major hemorrhagic complication, hypertensive heart disease with chronic diastolic heart failure bradycardia and hyperlipidemia last seen 01/14/2022 he also has chronic renal insufficiency and his  GFR 01/15/2022 was 28 cc creatinine 2.17 potassium 4.3. Mammogram Compliance with diet, lifestyle and medications: Yes  2 weeks before Christmas had a large family gathering majority of people develop a respiratory illness and when tested they had acute COVID infection. Both Mr. and Lawrence Myers developed the same he did not have much of a fever but he had cough he felt sick sputum production and subsequently an ear infection. He has slowly and steadily improved He has had no edema orthopnea chest pain palpitations syncope and no bleeding complication of his anticoagulant He has had labs done with his PCP but has not had thyroid studies since.  From renal repeat his CMP today because of the severity of his chronic kidney disease Past Medical History:  Diagnosis Date   Atrial fibrillation with rapid ventricular response ([place]) 11/16/2017   Last Assessment & Plan:  Remains in sinus rhythm but his heart rate is around 100. Will add metoprolol 25 mg q.6 hours.  Continue to monitor.  As noted above recommend starting anticoagulant therapy when okay from a surgical stand point   Bronchitis 10/20/2017   Cervical stenosis of spinal canal 11/11/2017   Chronic left shoulder pain 08/20/2017   Chronic pain of right knee 08/20/2017   Elevated troponin I level 11/16/2017   Last Assessment & Plan:  -given the lack of chest discomfort, this most likely represents type 2 NSTEMI, demand ischemia, in the setting of rapid atrial fibrillation -echocardiogram shows EF of 50-55% -no further workup   Essential hypertension 02/04/2017   Last Assessment & Plan:  -home regimen includes lisinopril-hydrochlorothiazide -will hold this in the setting  of elevated creatinine -is on metoprolol at this time in the setting of AFib, will continue -continue monitor blood pressure and make adjustments as necessary   GERD without esophagitis 08/20/2017   Hematoma of neck 11/19/2017   History of colon polyps    Hyperlipidemia  11/16/2017   Last Assessment & Plan:  -continue simvastatin   Hypokalemia 11/16/2017   Last Assessment & Plan:  Replete with IV fluids   Impaired functional mobility, balance, gait, and endurance 12/11/2017   Intractable hiccups    Malaise and fatigue 08/20/2017   Mixed dyslipidemia 02/04/2017   Oropharyngeal dysphagia 12/11/2017   Paroxysmal atrial fibrillation ([place]) 11/24/2017   Productive cough 10/12/2017   Stage 3 chronic kidney disease ([place]) 11/16/2017   Last Assessment & Plan:  Review of care everywhere shows previous creatinines over the last 6 months between 1.4 and 1.6.  Gently hydrating repleting potassium   Status post cervical spinal fusion 11/16/2017   Last Assessment & Plan:  -underwent this March 13th in [place]) -complicated by moderate hematoma left anterior neck -has mild sore throat but able to swallow and currently without respiratory distress -however patient does report that he feels his hematoma is slightly enlarged today -he had been started on anticoagulation in the setting of AFIB and thromboembolic risk  Plan...   Tick bite 04/22/2017   Tinea cruris 08/20/2017   Type 2 diabetes mellitus without complication, without long-term current use of insulin ([place]) 08/20/2017   Last Assessment & Plan:  -currently on metformin; holding during admission -POC glucose -SSI -diabetic diet   Vitamin D deficiency 08/20/2017    Past Surgical History:  Procedure Laterality Date   CARPAL TUNNEL RELEASE     CERVICAL SPINE SURGERY     COLONOSCOPY  04/21/2007   Small colonic polyp status post polypectomy. Pancolonic diverticulosis predominantly in the left colon. Small internal hemorrhoids.   ESOPHAGOGASTRECTOMY  04/28/2016   Schatzki's ring status post esophageal dilitation. Small hiatal hernia. Gastric ulcers(biopsied) Presbyesophagus with suggestion of esophageal dysmotility.   PROSTATE SURGERY      Current Medications: Current Meds  Medication Sig    amiodarone (PACERONE) 200 MG tablet TAKE 1 TABLET EVERY DAY   aspirin EC 81 MG tablet Take 1 tablet (81 mg total) by mouth daily.   furosemide (LASIX) 40 MG tablet TAKE 1 TABLET EVERY DAY   metoprolol succinate (TOPROL-XL) 25 MG 24 hr tablet TAKE 1 TABLET EVERY DAY (Patient taking differently: Take 25 mg by mouth daily. Take 0.5 ( 12.5 mg) tablet once daily)   pravastatin (PRAVACHOL) 40 MG tablet Take 1 tablet (40 mg total) by mouth at bedtime.   tamsulosin (FLOMAX) 0.4 MG CAPS capsule Take 0.4 mg by mouth daily after breakfast.     Allergies:   Patient has no known allergies.   Social History   Socioeconomic History   Marital status: Married    Spouse name: Not on file   Number of children: Not on file   Years of education: Not on file   Highest education level: Not on file  Occupational History   Not on file  Tobacco Use   Smoking status: Former    Passive exposure: Past   Smokeless tobacco: Never  Vaping Use   Vaping Use: Never used  Substance and Sexual Activity   Alcohol use: Not Currently   Drug use: Not Currently   Sexual activity: Not on file  Other Topics Concern   Not on file  Social History Narrative  Not on file   Social Determinants of Health   Financial Resource Strain: Not on file  Food Insecurity: Not on file  Transportation Needs: Not on file  Physical Activity: Not on file  Stress: Not on file  Social Connections: Not on file     Family History: The patient's family history includes Arthritis in his father and mother; Heart disease in his mother. ROS:   Please see the history of present illness.    All other systems reviewed and are negative.  EKGs/Labs/Other Studies Reviewed:    The following studies were reviewed today:  EKG:  EKG ordered today and personally reviewed.  The ekg ordered today demonstrates that he is maintaining sinus rhythm otherwise normal EKG  Recent Labs: 01/14/2022: ALT 13; BUN 32; Creatinine, Ser 2.17; Potassium 4.3;  Sodium 143; TSH 0.573  Recent Lipid Panel    Component Value Date/Time   CHOL 120 01/14/2022 1150   TRIG 93 01/14/2022 1150   HDL 39 (L) 01/14/2022 1150   CHOLHDL 3.1 01/14/2022 1150   LDLCALC 63 01/14/2022 1150    Physical Exam:    VS:  BP 138/64 (BP Location: Right Arm, Patient Position: Sitting)   Pulse 70   Ht _0  (1.702 m)   Wt 205 lb (93 kg)   SpO2 96%   BMI 32.11 kg/m     Wt Readings from Last 3 Encounters:  09/04/22 205 lb (93 kg)  07/15/21 200 lb 9.6 oz (91 kg)  01/16/21 194 lb (88 kg)     GEN: He does not look acutely or chronically ill and looks younger than his age well nourished, well developed in no acute distress HEENT: Normal NECK: No JVD; No carotid bruits LYMPHATICS: No lymphadenopathy CARDIAC: RRR, no murmurs, rubs, gallops RESPIRATORY:  Clear to auscultation without rales, wheezing or rhonchi  ABDOMEN: Soft, non-tender, non-distended MUSCULOSKELETAL:  No edema; No deformity  SKIN: Warm and dry NEUROLOGIC:  Alert and oriented x 3 PSYCHIATRIC:  Normal affect    Signed, Lawrence More, MD  09/04/2022 9:19 AM    [place]

## 2022-09-04 ENCOUNTER — Ambulatory Visit: Payer: Medicare HMO | Attending: Cardiology | Admitting: Cardiology

## 2022-09-04 ENCOUNTER — Encounter: Payer: Self-pay | Admitting: Cardiology

## 2022-09-04 VITALS — BP 138/64 | HR 70 | Ht 67.0 in | Wt 205.0 lb

## 2022-09-04 DIAGNOSIS — I5032 Chronic diastolic (congestive) heart failure: Secondary | ICD-10-CM

## 2022-09-04 DIAGNOSIS — I480 Paroxysmal atrial fibrillation: Secondary | ICD-10-CM

## 2022-09-04 DIAGNOSIS — I110 Hypertensive heart disease with heart failure: Secondary | ICD-10-CM

## 2022-09-04 DIAGNOSIS — E782 Mixed hyperlipidemia: Secondary | ICD-10-CM

## 2022-09-04 DIAGNOSIS — Z79899 Other long term (current) drug therapy: Secondary | ICD-10-CM

## 2022-09-04 DIAGNOSIS — N184 Chronic kidney disease, stage 4 (severe): Secondary | ICD-10-CM | POA: Diagnosis not present

## 2022-09-04 NOTE — Patient Instructions (Signed)
Medication Instructions:  Your physician recommends that you continue on your current medications as directed. Please refer to the Current Medication list given to you today.  *If you need a refill on your cardiac medications before your next appointment, please call your pharmacy*   Lab Work: Your physician recommends that you return for lab work in:   Labs today: CMP, TSH T3 T4  If you have labs (blood work) drawn today and your tests are completely normal, you will receive your results only by: MyChart Message (if you have MyChart) OR A paper copy in the mail If you have any lab test that is abnormal or we need to change your treatment, we will call you to review the results.   Testing/Procedures: None   Follow-Up: At [facility], you and your health needs are our priority.  As part of our continuing mission to provide you with exceptional heart care, we have created designated Provider Care Teams.  These Care Teams include your primary Cardiologist (physician) and Advanced Practice Providers (APPs -  Physician Assistants and Nurse Practitioners) who all work together to provide you with the care you need, when you need it.  We recommend signing up for the patient portal called "MyChart".  Sign up information is provided on this After Visit Summary.  MyChart is used to connect with patients for Virtual Visits (Telemedicine).  Patients are able to view lab/test results, encounter notes, upcoming appointments, etc.  Non-urgent messages can be sent to your provider as well.   To learn more about what you can do with MyChart, go to [email or web address].    Your next appointment:   6 month(s)  The format for your next appointment:   In Person  Provider:   Brian Munley, MD    Other Instructions None  Important Information About Sugar       

## 2022-09-05 LAB — COMPREHENSIVE METABOLIC PANEL
ALT: 16 IU/L (ref 0–44)
AST: 18 IU/L (ref 0–40)
Albumin/Globulin Ratio: 1.6 (ref 1.2–2.2)
Albumin: 3.9 g/dL (ref 3.6–4.6)
Alkaline Phosphatase: 101 IU/L (ref 44–121)
BUN/Creatinine Ratio: 17 (ref 10–24)
BUN: 32 mg/dL (ref 10–36)
Bilirubin Total: 0.3 mg/dL (ref 0.0–1.2)
CO2: 24 mmol/L (ref 20–29)
Calcium: 9.3 mg/dL (ref 8.6–10.2)
Chloride: 103 mmol/L (ref 96–106)
Creatinine, Ser: 1.93 mg/dL — ABNORMAL HIGH (ref 0.76–1.27)
Globulin, Total: 2.5 g/dL (ref 1.5–4.5)
Glucose: 94 mg/dL (ref 70–99)
Potassium: 4 mmol/L (ref 3.5–5.2)
Sodium: 141 mmol/L (ref 134–144)
Total Protein: 6.4 g/dL (ref 6.0–8.5)
eGFR: 32 mL/min/{1.73_m2} — ABNORMAL LOW (ref >59)

## 2022-09-05 LAB — TSH+T4F+T3FREE
Free T4: 1.72 ng/dL (ref 0.82–1.77)
T3, Free: 2.1 pg/mL (ref 2.0–4.4)
TSH: 0.713 u[IU]/mL (ref 0.450–4.500)

## 2022-09-08 ENCOUNTER — Telehealth: Payer: Self-pay | Admitting: Cardiology

## 2022-09-08 NOTE — Telephone Encounter (Signed)
Daughter is calling requesting to pick up a copy of patient's 01/04 lab results to take to his kidney doctor he is seeing on 01/10. Please advise.

## 2022-09-08 NOTE — Telephone Encounter (Signed)
Called Mila Homer, the patients daughter and informed her that I had left a copy of the patient's lab work from 1/4 at the front desk for her to pick -up. She was appreciative for the call and had no further questions at this time.

## 2022-10-19 ENCOUNTER — Other Ambulatory Visit: Payer: Self-pay | Admitting: Cardiology

## 2023-04-19 NOTE — Progress Notes (Unsigned)
Cardiology Office Note:    Date:  04/20/2023   ID:  Lawrence Myers, DOB 1927-01-27, MRN [id number]  PCP:  Hadley Pen, MD  Cardiologist:  Norman Herrlich, MD    Referring MD: Hadley Pen, MD    ASSESSMENT:    1. Paroxysmal atrial fibrillation (HCC)   2. On amiodarone therapy   3. Hypertensive heart disease with chronic diastolic congestive heart failure (HCC)   4. Stage 3a chronic kidney disease (HCC)    PLAN:    In order of problems listed above:  [name] continues to do well he takes low-dose amiodarone to maintain sinus rhythm along with his beta-blocker. Purposely not anticoagulate because of previous major hemorrhagic complication His heart failure is nicely compensated Stable CKD   Next appointment: 6 months   Medication Adjustments/Labs and Tests Ordered: Current medicines are reviewed at length with the patient today.  Concerns regarding medicines are outlined above.  Orders Placed This Encounter  Procedures   EKG 12-Lead   No orders of the defined types were placed in this encounter.    History of Present Illness:    Lawrence Myers is a 89 y.o. male with a hx of paroxysmal atrial fibrillation maintaining sinus rhythm on low-dose amiodarone he is purposely not anticoagulant because of previous major hemorrhagic complication hypertensive heart disease or chronic diastolic heart failure sinus bradycardia hyperlipidemia and stage IV CKD last seen 09/04/2022.  10/11/2021 hemoglobin 12.0 platelets mildly diminished 135,000 CMP showed a creatinine 2.11 GFR 28 cc/min sodium 139 potassium 3.9  Compliance with diet, lifestyle and medications: Yes this  His predominant complaint is that he fatigues easily He has had no complaints of recurrent atrial fibrillation edema shortness of breath chest pain palpitation or syncope He is concerned about his wife she had a recent fall and an ED visit. Past Medical History:  Diagnosis Date   Atrial  fibrillation with rapid ventricular response (HCC) 11/16/2017   Last Assessment & Plan:  Remains in sinus rhythm but his heart rate is around 100. Will add metoprolol 25 mg q.6 hours.  Continue to monitor.  As noted above recommend starting anticoagulant therapy when okay from a surgical stand point   Bradycardia 09/20/2020   Bronchitis 10/20/2017   Cervical stenosis of spinal canal 11/11/2017   Chest wall pain 03/08/2020   Chronic left shoulder pain 08/20/2017   Chronic pain of right knee 08/20/2017   Elevated troponin I level 11/16/2017   Last Assessment & Plan:  -given the lack of chest discomfort, this most likely represents type 2 NSTEMI, demand ischemia, in the setting of rapid atrial fibrillation -echocardiogram shows EF of 50-55% -no further workup   Essential hypertension 02/04/2017   Last Assessment & Plan:  -home regimen includes lisinopril-hydrochlorothiazide -will hold this in the setting of elevated creatinine -is on metoprolol at this time in the setting of AFib, will continue -continue monitor blood pressure and make adjustments as necessary   GERD without esophagitis 08/20/2017   Hematoma of neck 11/19/2017   History of colon polyps    Hyperlipidemia 11/16/2017   Last Assessment & Plan:  -continue simvastatin   Hypokalemia 11/16/2017   Last Assessment & Plan:  Replete with IV fluids   Impaired functional mobility, balance, gait, and endurance 12/11/2017   Intractable hiccups    Malaise and fatigue 08/20/2017   Mixed dyslipidemia 02/04/2017   Oropharyngeal dysphagia 12/11/2017   Paroxysmal atrial fibrillation (HCC) 11/24/2017   Productive cough 10/12/2017   Stage 3 chronic kidney  disease (HCC) 11/16/2017   Last Assessment & Plan:  Review of care everywhere shows previous creatinines over the last 6 months between 1.4 and 1.6.  Gently hydrating repleting potassium   Status post cervical spinal fusion 11/16/2017   Last Assessment & Plan:  -[place] -complicated by moderate hematoma left anterior neck -has mild sore throat but able to swallow and currently without respiratory distress -however patient does report that he feels his hematoma is slightly enlarged today -he had been started on anticoagulation in the setting of AFIB and thromboembolic risk  Plan...   Tick bite 04/22/2017   Tinea cruris 08/20/2017   Type 2 diabetes mellitus without complication, without long-term current use of insulin (HCC) 08/20/2017   Last Assessment & Plan:  -currently on metformin; holding during admission -POC glucose -SSI -diabetic diet   Vitamin D deficiency 08/20/2017    Current Medications: Current Meds  Medication Sig   amiodarone (PACERONE) 200 MG tablet TAKE 1 TABLET EVERY DAY   aspirin EC 81 MG tablet Take 1 tablet (81 mg total) by mouth daily.   furosemide (LASIX) 40 MG tablet TAKE 1 TABLET EVERY DAY   metoprolol succinate (TOPROL-XL) 25 MG 24 hr tablet TAKE 1 TABLET EVERY DAY   pravastatin (PRAVACHOL) 40 MG tablet TAKE 1 TABLET AT BEDTIME   tamsulosin (FLOMAX) 0.4 MG CAPS capsule Take 0.4 mg by mouth daily after breakfast.      EKGs/Labs/Other Studies Reviewed:    The following studies were reviewed today:          Recent Labs: 09/04/2022: ALT 16; BUN 32; Creatinine, Ser 1.93; Potassium 4.0; Sodium 141; TSH 0.713  Recent Lipid Panel    Component Value Date/Time   CHOL 120 01/14/2022 1150   TRIG 93 01/14/2022 1150   HDL 39 (L) 01/14/2022 1150   CHOLHDL 3.1 01/14/2022 1150   LDLCALC 63 01/14/2022 1150   EKG Interpretation Date/Time:  Monday April 20 2023 13:14:42 EDT Ventricular Rate:  50 PR Interval:  172 QRS Duration:  84 QT Interval:  470 QTC Calculation: 428 R Axis:   21  Text Interpretation: Sinus bradycardia When compared with ECG of 17-May-2008 13:30, No significant change was found Confirmed by Norman Herrlich ([id number]) on 04/20/2023 1:28:27 PM   Physical Exam:    VS:  BP (!) 110/56 (BP  Location: Right Arm, Patient Position: Sitting, Cuff Size: Normal)   Pulse (!) 49   Ht 5\' 6"  (1.676 m)   Wt 198 lb 6.4 oz (90 kg)   SpO2 98%   BMI 32.02 kg/m     Wt Readings from Last 3 Encounters:  04/20/23 198 lb 6.4 oz (90 kg)  09/04/22 205 lb (93 kg)  07/15/21 200 lb 9.6 oz (91 kg)     GEN: He looks his age well nourished, well developed in no acute distress HEENT: Normal NECK: No JVD; No carotid bruits LYMPHATICS: No lymphadenopathy CARDIAC: RRR, no murmurs, rubs, gallops RESPIRATORY:  Clear to auscultation without rales, wheezing or rhonchi  ABDOMEN: Soft, non-tender, non-distended MUSCULOSKELETAL:  No edema; No deformity  SKIN: Warm and dry NEUROLOGIC:  Alert and oriented x 3 PSYCHIATRIC:  Normal affect    Signed, Norman Herrlich, MD  04/20/2023 1:11 PM    [facility]

## 2023-04-20 ENCOUNTER — Ambulatory Visit: Payer: Medicare HMO | Attending: Cardiology | Admitting: Cardiology

## 2023-04-20 ENCOUNTER — Encounter: Payer: Self-pay | Admitting: Cardiology

## 2023-04-20 VITALS — BP 110/56 | HR 49 | Ht 66.0 in | Wt 198.4 lb

## 2023-04-20 DIAGNOSIS — I480 Paroxysmal atrial fibrillation: Secondary | ICD-10-CM

## 2023-04-20 DIAGNOSIS — N1831 Chronic kidney disease, stage 3a: Secondary | ICD-10-CM

## 2023-04-20 DIAGNOSIS — Z79899 Other long term (current) drug therapy: Secondary | ICD-10-CM

## 2023-04-20 DIAGNOSIS — I110 Hypertensive heart disease with heart failure: Secondary | ICD-10-CM

## 2023-04-20 DIAGNOSIS — I5032 Chronic diastolic (congestive) heart failure: Secondary | ICD-10-CM

## 2023-04-20 NOTE — Patient Instructions (Signed)

## 2023-08-08 ENCOUNTER — Other Ambulatory Visit: Payer: Self-pay | Admitting: Cardiology

## 2023-10-05 ENCOUNTER — Other Ambulatory Visit: Payer: Self-pay | Admitting: Cardiology

## 2023-11-02 ENCOUNTER — Encounter: Payer: Self-pay | Admitting: Cardiology

## 2023-11-02 NOTE — Progress Notes (Unsigned)
 Cardiology Office Note:    Date:  11/03/2023   ID:  Lawrence Myers, DOB Sep 19, 1926, MRN [id number]  PCP:  Lawrence Pen, MD  Cardiologist:  Lawrence Herrlich, MD    Referring MD: Lawrence Pen, MD    ASSESSMENT:    1. Paroxysmal atrial fibrillation (HCC)   2. On amiodarone therapy   3. Hypertensive heart disease with chronic diastolic congestive heart failure (HCC)   4. CKD (chronic kidney disease) stage 4, GFR 15-29 ml/min (HCC)   5. Mixed dyslipidemia    PLAN:    In order of problems listed above:  Lawrence Myers continues to do well maintaining sinus rhythm at a minimum dose of amiodarone without toxicity Recheck labs for liver thyroid function BP is at target he will continue his current beta-blocker at this time does not require loop diuretic Stable severe CKD Continue low intensity statin check liver profile lipids   Next appointment: 6 months for amiodarone follow-up   Medication Adjustments/Labs and Tests Ordered: Current medicines are reviewed at length with the patient today.  Concerns regarding medicines are outlined above.  Orders Placed This Encounter  Procedures   EKG 12-Lead   No orders of the defined types were placed in this encounter.    History of Present Illness:    Lawrence Myers is a 89 y.o. male with a hx of paroxysmal atrial fibrillation on low-dose amiodarone and purposely not anticoagulated because of previous major hemorrhagic complication hypertensive heart disease or chronic diastolic heart failure and stage4CKD last seen 04/20/2023.  Recent labs 04/03/2023 hemoglobin is 12 platelet count mildly diminished 111 sodium 139 potassium 3.9 creatinine 2.11 GFR 28 cc stage IV CKD  Compliance with diet, lifestyle and medications: Yes  Tod continues to do quite well maintaining sinus rhythm on low-dose amiodarone no apparent toxicity but will plan on rechecking labs including lipid and thyroid. Your remains active and has not had  angina edema shortness of breath palpitation or syncope He tolerates his statin without muscle pain or weakness Our lab is closed today we will bring him back to do his CMP thyroid panel and lipids Past Medical History:  Diagnosis Date   Atrial fibrillation with rapid ventricular response (HCC) 11/16/2017   Last Assessment & Plan:  Remains in sinus rhythm but his heart rate is around 100. Will add metoprolol 25 mg q.6 hours.  Continue to monitor.  As noted above recommend starting anticoagulant therapy when okay from a surgical stand point   Bradycardia 09/20/2020   Bronchitis 10/20/2017   Cervical stenosis of spinal canal 11/11/2017   Chest wall pain 03/08/2020   Chronic left shoulder pain 08/20/2017   Chronic pain of right knee 08/20/2017   Elevated troponin 11/16/2017   Last Assessment & Plan:   -given the lack of chest discomfort, this most likely represents type 2 NSTEMI, demand ischemia, in the setting of rapid atrial fibrillation  -echocardiogram shows EF of 50-55%  -no further workup     Elevated troponin I level 11/16/2017   Last Assessment & Plan:  -given the lack of chest discomfort, this most likely represents type 2 NSTEMI, demand ischemia, in the setting of rapid atrial fibrillation -echocardiogram shows EF of 50-55% -no further workup   Essential hypertension 02/04/2017   Last Assessment & Plan:  -home regimen includes lisinopril-hydrochlorothiazide -will hold this in the setting of elevated creatinine -is on metoprolol at this time in the setting of AFib, will continue -continue monitor blood pressure and make adjustments as necessary  GERD without esophagitis 08/20/2017   Hematoma of neck 11/19/2017   History of colon polyps    Hyperlipidemia 11/16/2017   Last Assessment & Plan:  -continue simvastatin   Hypertensive heart disease 02/04/2017   Last Assessment & Plan:   -home regimen includes lisinopril-hydrochlorothiazide  -will hold this in the setting of elevated  creatinine  -is on metoprolol at this time in the setting of AFib, will continue  -continue monitor blood pressure and make adjustments as necessary     Hypokalemia 11/16/2017   Last Assessment & Plan:  Replete with IV fluids   Impaired functional mobility, balance, gait, and endurance 12/11/2017   Intractable hiccups    Malaise and fatigue 08/20/2017   Mixed dyslipidemia 02/04/2017   Oropharyngeal dysphagia 12/11/2017   Paroxysmal atrial fibrillation (HCC) 11/24/2017   Productive cough 10/12/2017   Stage 3 chronic kidney disease (HCC) 11/16/2017   Last Assessment & Plan:  Review of care everywhere shows previous creatinines over the last 6 months between 1.4 and 1.6.  Gently hydrating repleting potassium   Status post cervical spinal fusion 11/16/2017   Last Assessment & Plan:  -[place] this [place] -complicated by moderate hematoma left anterior neck -has mild sore throat but able to swallow and currently without respiratory distress -however patient does report that he feels his hematoma is slightly enlarged today -he had been started on anticoagulation in the setting of AFIB and thromboembolic risk  Plan...   Tick bite 04/22/2017   Tinea cruris 08/20/2017   Type 2 diabetes mellitus without complication, without long-term current use of insulin (HCC) 08/20/2017   Last Assessment & Plan:  -currently on metformin; holding during admission -POC glucose -SSI -diabetic diet   Vitamin D deficiency 08/20/2017    Current Medications: Current Meds  Medication Sig   amiodarone (PACERONE) 200 MG tablet TAKE 1 TABLET EVERY DAY   aspirin EC 81 MG tablet Take 1 tablet (81 mg total) by mouth daily.   finasteride (PROSCAR) 5 MG tablet Take 5 mg by mouth daily.   metoprolol succinate (TOPROL-XL) 25 MG 24 hr tablet Take 1 tablet (25 mg total) by mouth daily.   pravastatin (PRAVACHOL) 40 MG tablet TAKE 1 TABLET AT BEDTIME      EKGs/Labs/Other Studies Reviewed:     The following studies were reviewed today:      EKG Interpretation Date/Time:  Tuesday November 03 2023 13:58:44 EST Ventricular Rate:  76 PR Interval:  182 QRS Duration:  76 QT Interval:  402 QTC Calculation: 452 R Axis:   16  Text Interpretation: Normal sinus rhythm Nonspecific ST abnormality When compared with ECG of 20-Apr-2023 13:14, No significant change since last tracing Confirmed by Lawrence Myers ([id number]) on 11/03/2023 2:06:01 PM   Recent Labs: No results found for requested labs within last 365 days.  Recent Lipid Panel    Component Value Date/Time   CHOL 120 01/14/2022 1150   TRIG 93 01/14/2022 1150   HDL 39 (L) 01/14/2022 1150   CHOLHDL 3.1 01/14/2022 1150   LDLCALC 63 01/14/2022 1150    Physical Exam:    VS:  BP 126/68   Pulse 76   Ht 5\' 7"  (1.702 m)   Wt 205 lb 6.4 oz (93.2 kg)   SpO2 94%   BMI 32.17 kg/m     Wt Readings from Last 3 Encounters:  11/03/23 205 lb 6.4 oz (93.2 kg)  04/20/23 198 lb 6.4 oz (90 kg)  09/04/22 205 lb (93  kg)     GEN:  Well nourished, well developed in no acute distress HEENT: Normal NECK: No JVD; No carotid bruits LYMPHATICS: No lymphadenopathy CARDIAC: RRR, no murmurs, rubs, gallops RESPIRATORY:  Clear to auscultation without rales, wheezing or rhonchi  ABDOMEN: Soft, non-tender, non-distended MUSCULOSKELETAL:  No edema; No deformity  SKIN: Warm and dry NEUROLOGIC:  Alert and oriented x 3 PSYCHIATRIC:  Normal affect    Signed, Lawrence Herrlich, MD  11/03/2023 2:21 PM    [facility]

## 2023-11-03 ENCOUNTER — Encounter: Payer: Self-pay | Admitting: Cardiology

## 2023-11-03 ENCOUNTER — Ambulatory Visit: Payer: Medicare HMO | Attending: Cardiology | Admitting: Cardiology

## 2023-11-03 VITALS — BP 126/68 | HR 76 | Ht 67.0 in | Wt 205.4 lb

## 2023-11-03 DIAGNOSIS — E782 Mixed hyperlipidemia: Secondary | ICD-10-CM

## 2023-11-03 DIAGNOSIS — I110 Hypertensive heart disease with heart failure: Secondary | ICD-10-CM | POA: Diagnosis not present

## 2023-11-03 DIAGNOSIS — I480 Paroxysmal atrial fibrillation: Secondary | ICD-10-CM

## 2023-11-03 DIAGNOSIS — N184 Chronic kidney disease, stage 4 (severe): Secondary | ICD-10-CM

## 2023-11-03 DIAGNOSIS — I5032 Chronic diastolic (congestive) heart failure: Secondary | ICD-10-CM

## 2023-11-03 DIAGNOSIS — Z79899 Other long term (current) drug therapy: Secondary | ICD-10-CM

## 2023-11-03 NOTE — Patient Instructions (Signed)
 Medication Instructions:  Your physician recommends that you continue on your current medications as directed. Please refer to the Current Medication list given to you today.  *If you need a refill on your cardiac medications before your next appointment, please call your pharmacy*   Lab Work: Your physician recommends that you return for lab work in:   Labs today: CMP, Lipids, TSH T3 T4  If you have labs (blood work) drawn today and your tests are completely normal, you will receive your results only by: MyChart Message (if you have MyChart) OR A paper copy in the mail If you have any lab test that is abnormal or we need to change your treatment, we will call you to review the results.   Testing/Procedures: None   Follow-Up: At [facility], you and your health needs are our priority.  As part of our continuing mission to provide you with exceptional heart care, we have created designated Provider Care Teams.  These Care Teams include your primary Cardiologist (physician) and Advanced Practice Providers (APPs -  Physician Assistants and Nurse Practitioners) who all work together to provide you with the care you need, when you need it.  We recommend signing up for the patient portal called "MyChart".  Sign up information is provided on this After Visit Summary.  MyChart is used to connect with patients for Virtual Visits (Telemedicine).  Patients are able to view lab/test results, encounter notes, upcoming appointments, etc.  Non-urgent messages can be sent to your provider as well.   To learn more about what you can do with MyChart, go to [email or web address].    Your next appointment:   6 month(s)  Provider:   Norman Herrlich, MD    Other Instructions None

## 2023-11-06 LAB — LIPID PANEL
Chol/HDL Ratio: 2.7 ratio (ref 0.0–5.0)
Cholesterol, Total: 133 mg/dL (ref 100–199)
HDL: 49 mg/dL (ref >39)
LDL Chol Calc (NIH): 70 mg/dL (ref 0–99)
Triglycerides: 69 mg/dL (ref 0–149)
VLDL Cholesterol Cal: 14 mg/dL (ref 5–40)

## 2023-11-06 LAB — COMPREHENSIVE METABOLIC PANEL
ALT: 19 IU/L (ref 0–44)
AST: 24 IU/L (ref 0–40)
Albumin: 4.2 g/dL (ref 3.6–4.6)
Alkaline Phosphatase: 110 IU/L (ref 44–121)
BUN/Creatinine Ratio: 14 (ref 10–24)
BUN: 25 mg/dL (ref 10–36)
Bilirubin Total: 0.5 mg/dL (ref 0.0–1.2)
CO2: 21 mmol/L (ref 20–29)
Calcium: 9.7 mg/dL (ref 8.6–10.2)
Chloride: 104 mmol/L (ref 96–106)
Creatinine, Ser: 1.85 mg/dL — ABNORMAL HIGH (ref 0.76–1.27)
Globulin, Total: 2.6 g/dL (ref 1.5–4.5)
Glucose: 95 mg/dL (ref 70–99)
Potassium: 4.4 mmol/L (ref 3.5–5.2)
Sodium: 142 mmol/L (ref 134–144)
Total Protein: 6.8 g/dL (ref 6.0–8.5)
eGFR: 33 mL/min/{1.73_m2} — ABNORMAL LOW (ref >59)

## 2023-11-06 LAB — TSH+T4F+T3FREE
Free T4: 1.81 ng/dL — ABNORMAL HIGH (ref 0.82–1.77)
T3, Free: 2.1 pg/mL (ref 2.0–4.4)
TSH: 1.27 u[IU]/mL (ref 0.450–4.500)

## 2024-01-04 ENCOUNTER — Other Ambulatory Visit: Payer: Self-pay | Admitting: Cardiology

## 2024-02-29 ENCOUNTER — Other Ambulatory Visit: Payer: Self-pay | Admitting: Cardiology

## 2024-02-29 NOTE — Telephone Encounter (Signed)
 Rx refill sent to pharmacy.

## 2024-04-29 ENCOUNTER — Telehealth: Payer: Self-pay | Admitting: Cardiology

## 2024-04-29 NOTE — Telephone Encounter (Signed)
 STAT if HR is under 50 or over 120 (normal HR is 60-100 beats per minute)  What is your heart rate?    Resting - 47bpm Walking - 60-65 bpm  Do you have a log of your heart rate readings (document readings)?   Yes  Do you have any other symptoms?   Feels weak  Caller Juris) reports patient has been having low HR reading on Wednesday and Friday of this week.  Caller stated patient has also stated that he feels weak toward the end of the day.  Caller wants a call back regarding next steps.

## 2024-05-04 NOTE — Telephone Encounter (Signed)
 Left message for nicole to call, also left message for patient to call. He is due his 6 month follow up.

## 2024-05-05 NOTE — Progress Notes (Unsigned)
 Cardiology Office Note:    Date:  05/06/2024   ID:  Lawrence Myers, DOB 12/20/1926, MRN [id number]  PCP:  Silver Lamar LABOR, MD  Cardiologist:  Redell Leiter, MD    Referring MD: Silver Lamar LABOR, MD    ASSESSMENT:    1. Paroxysmal atrial fibrillation (HCC)   2. Bradycardia   3. On amiodarone  therapy   4. Hypertensive heart disease with chronic diastolic congestive heart failure (HCC)   5. Stage 3a chronic kidney disease (HCC)    PLAN:    In order of problems listed above:  Overall doing well good opportunity reduce both dose amiodarone  and metoprolol  Stable hypertension heart failure has no edema continues current loop diuretic Continue with statin   Next appointment: 6 months   Medication Adjustments/Labs and Tests Ordered: Current medicines are reviewed at length with the patient today.  Concerns regarding medicines are outlined above.  Orders Placed This Encounter  Procedures   EKG 12-Lead   No orders of the defined types were placed in this encounter.    History of Present Illness:    Lawrence Myers is a 89 y.o. male with a hx of  paroxysmal atrial fibrillation on low-dose amiodarone  and purposely not anticoagulated because of previous major hemorrhagic complication hypertensive heart disease or chronic diastolic heart failure and stage 4 CKD last seen 11/03/2023.SABRA Compliance with diet, lifestyle and medications: Yes  Open very complicated visit the visiting nursing assistant checks pulse at rest has been down in the mid 40s and she told him to come and see me for med adjustment Fortunately he is asymptomatic no lightheadedness or syncope He is on both amiodarone  but Toprol  decreased both by 50% weight 2 weeks into the 1 weeks ZIO monitor. He is doing well long-term no recurrent atrial fibrillation and is not anticoagulant because of a major hemorrhagic complication after spine surgery. He is not having angina edema shortness of breath.  Labs 0  11/05/2023 cholesterol 133 LDL 70 creatinine 1.85 TSH was normal 1.27 Past Medical History:  Diagnosis Date   Atrial fibrillation with rapid ventricular response (HCC) 11/16/2017   Last Assessment & Plan:  Remains in sinus rhythm but his heart rate is around 100. Will add metoprolol  25 mg q.6 hours.  Continue to monitor.  As noted above recommend starting anticoagulant therapy when okay from a surgical stand point   Bradycardia 09/20/2020   Bronchitis 10/20/2017   Cervical stenosis of spinal canal 11/11/2017   Chest wall pain 03/08/2020   Chronic left shoulder pain 08/20/2017   Chronic pain of right knee 08/20/2017   Elevated troponin 11/16/2017   Last Assessment & Plan:   -given the lack of chest discomfort, this most likely represents type 2 NSTEMI, demand ischemia, in the setting of rapid atrial fibrillation  -echocardiogram shows EF of 50-55%  -no further workup     Elevated troponin I level 11/16/2017   Last Assessment & Plan:  -given the lack of chest discomfort, this most likely represents type 2 NSTEMI, demand ischemia, in the setting of rapid atrial fibrillation -echocardiogram shows EF of 50-55% -no further workup   Essential hypertension 02/04/2017   Last Assessment & Plan:  -home regimen includes lisinopril-hydrochlorothiazide -will hold this in the setting of elevated creatinine -is on metoprolol  at this time in the setting of AFib, will continue -continue monitor blood pressure and make adjustments as necessary   GERD without esophagitis 08/20/2017   Hematoma of neck 11/19/2017   History of colon polyps  Hyperlipidemia 11/16/2017   Last Assessment & Plan:  -continue simvastatin   Hypertensive heart disease 02/04/2017   Last Assessment & Plan:   -home regimen includes lisinopril-hydrochlorothiazide  -will hold this in the setting of elevated creatinine  -is on metoprolol  at this time in the setting of AFib, will continue  -continue monitor blood pressure and make adjustments as  necessary     Hypokalemia 11/16/2017   Last Assessment & Plan:  Replete with IV fluids   Impaired functional mobility, balance, gait, and endurance 12/11/2017   Intractable hiccups    Malaise and fatigue 08/20/2017   Mixed dyslipidemia 02/04/2017   Oropharyngeal dysphagia 12/11/2017   Paroxysmal atrial fibrillation (HCC) 11/24/2017   Productive cough 10/12/2017   Stage 3 chronic kidney disease (HCC) 11/16/2017   Last Assessment & Plan:  Review of care everywhere shows previous creatinines over the last 6 months between 1.4 and 1.6.  Gently hydrating repleting potassium   Status post cervical spinal fusion 11/16/2017   Last Assessment & Plan:  -[place]went this March [place]th in [place] -complicated by moderate hematoma left anterior neck -has mild sore throat but able to swallow and currently without respiratory distress -however patient does report that he feels his hematoma is slightly enlarged today -he had been started on anticoagulation in the setting of AFIB and thromboembolic risk  Plan...   Tick bite 04/22/2017   Tinea cruris 08/20/2017   Type 2 diabetes mellitus without complication, without long-term current use of insulin (HCC) 08/20/2017   Last Assessment & Plan:  -currently on metformin; holding during admission -POC glucose -SSI -diabetic diet   Vitamin D deficiency 08/20/2017    Current Medications: Current Meds  Medication Sig   amiodarone  (PACERONE ) 200 MG tablet TAKE 1 TABLET EVERY DAY   aspirin  EC 81 MG tablet Take 1 tablet (81 mg total) by mouth daily.   furosemide  (LASIX ) 40 MG tablet Take 40 mg by mouth daily.   metoprolol  succinate (TOPROL -XL) 25 MG 24 hr tablet TAKE 1 TABLET EVERY DAY   pravastatin  (PRAVACHOL ) 40 MG tablet TAKE 1 TABLET AT BEDTIME   tamsulosin (FLOMAX) 0.4 MG CAPS capsule Take 0.4 mg by mouth daily.      EKGs/Labs/Other Studies Reviewed:    The following studies were reviewed today:          Recent Labs: 11/05/2023: ALT  19; BUN 25; Creatinine, Ser 1.85; Potassium 4.4; Sodium 142; TSH 1.270  Recent Lipid Panel    Component Value Date/Time   CHOL 133 11/05/2023 0901   TRIG 69 11/05/2023 0901   HDL 49 11/05/2023 0901   CHOLHDL 2.7 11/05/2023 0901   LDLCALC 70 11/05/2023 0901    Physical Exam:    VS:  BP (!) 130/58   Pulse (!) 56   Ht 5' 7 (1.702 m)   Wt 199 lb 12.8 oz (90.6 kg)   SpO2 97%   BMI 31.29 kg/m     Wt Readings from Last 3 Encounters:  05/06/24 199 lb 12.8 oz (90.6 kg)  11/03/23 205 lb 6.4 oz (93.2 kg)  04/20/23 198 lb 6.4 oz (90 kg)     GEN:  Well nourished, well developed in no acute distress HEENT: Normal NECK: No JVD; No carotid bruits LYMPHATICS: No lymphadenopathy CARDIAC: RRR, no murmurs, rubs, gallops RESPIRATORY:  Clear to auscultation without rales, wheezing or rhonchi  ABDOMEN: Soft, non-tender, non-distended MUSCULOSKELETAL:  No edema; No deformity  SKIN: Warm and dry NEUROLOGIC:  Alert and oriented x 3  PSYCHIATRIC:  Normal affect    Signed, Redell Leiter, MD  05/06/2024 2:22 PM    [facility]

## 2024-05-06 ENCOUNTER — Encounter: Payer: Self-pay | Admitting: Cardiology

## 2024-05-06 ENCOUNTER — Ambulatory Visit: Attending: Cardiology | Admitting: Cardiology

## 2024-05-06 VITALS — BP 130/58 | HR 56 | Ht 67.0 in | Wt 199.8 lb

## 2024-05-06 DIAGNOSIS — I480 Paroxysmal atrial fibrillation: Secondary | ICD-10-CM | POA: Diagnosis not present

## 2024-05-06 DIAGNOSIS — I5032 Chronic diastolic (congestive) heart failure: Secondary | ICD-10-CM

## 2024-05-06 DIAGNOSIS — I110 Hypertensive heart disease with heart failure: Secondary | ICD-10-CM | POA: Diagnosis not present

## 2024-05-06 DIAGNOSIS — N1831 Chronic kidney disease, stage 3a: Secondary | ICD-10-CM

## 2024-05-06 DIAGNOSIS — R001 Bradycardia, unspecified: Secondary | ICD-10-CM

## 2024-05-06 DIAGNOSIS — Z79899 Other long term (current) drug therapy: Secondary | ICD-10-CM | POA: Diagnosis not present

## 2024-05-06 MED ORDER — AMIODARONE HCL 200 MG PO TABS: 200.0000 mg | ORAL_TABLET | Freq: Every day | ORAL | Status: DC

## 2024-05-06 MED ORDER — AMIODARONE HCL 200 MG PO TABS: 200.0000 mg | ORAL_TABLET | ORAL | Status: AC

## 2024-05-06 MED ORDER — METOPROLOL SUCCINATE ER 25 MG PO TB24: 12.5000 mg | ORAL_TABLET | Freq: Every day | ORAL | 3 refills | Status: AC

## 2024-05-06 NOTE — Addendum Note (Signed)
 Addended by: SHERRE ADE I on: 05/06/2024 04:52 PM   Modules accepted: Orders

## 2024-05-06 NOTE — Patient Instructions (Signed)
 Medication Instructions:  Your physician has recommended you make the following change in your medication:  Reduce Metoprolol  to 12.5 mg once daily Reduce Amiodarone  to every other day  *If you need a refill on your cardiac medications before your next appointment, please call your pharmacy*  Lab Work: NONE If you have labs (blood work) drawn today and your tests are completely normal, you will receive your results only by: MyChart Message (if you have MyChart) OR A paper copy in the mail If you have any lab test that is abnormal or we need to change your treatment, we will call you to review the results.  Testing/Procedures: You have been asked to wear a [employer] Heart Monitor in 2 weeks. It is to be worn for 7 days. Please remove the monitor     and mail back in the box provided.  If you have any questions about the monitor please call the company at [phone]    Follow-Up: At [facility], you and your health needs are our priority.  As part of our continuing mission to provide you with exceptional heart care, our providers are all part of one team.  This team includes your primary Cardiologist (physician) and Advanced Practice Providers or APPs (Physician Assistants and Nurse Practitioners) who all work together to provide you with the care you need, when you need it.  Your next appointment:   6 month(s)  Provider:   Redell Leiter, MD    We recommend signing up for the patient portal called MyChart.  Sign up information is provided on this After Visit Summary.  MyChart is used to connect with patients for Virtual Visits (Telemedicine).  Patients are able to view lab/test results, encounter notes, upcoming appointments, etc.  Non-urgent messages can be sent to your provider as well.   To learn more about what you can do with MyChart, go to [email or web address].   Other Instructions

## 2024-05-20 ENCOUNTER — Ambulatory Visit: Attending: Cardiology

## 2024-05-20 DIAGNOSIS — I480 Paroxysmal atrial fibrillation: Secondary | ICD-10-CM

## 2024-05-20 DIAGNOSIS — R001 Bradycardia, unspecified: Secondary | ICD-10-CM

## 2024-06-06 DIAGNOSIS — I480 Paroxysmal atrial fibrillation: Secondary | ICD-10-CM | POA: Diagnosis not present

## 2024-06-06 DIAGNOSIS — R001 Bradycardia, unspecified: Secondary | ICD-10-CM

## 2024-06-07 ENCOUNTER — Ambulatory Visit: Payer: Self-pay

## 2024-06-07 NOTE — Telephone Encounter (Signed)
 Patient returned RN's call regarding results.

## 2024-10-06 ENCOUNTER — Ambulatory Visit: Admitting: Gastroenterology

## 2024-10-28 ENCOUNTER — Ambulatory Visit: Admitting: Gastroenterology

## 2024-11-02 ENCOUNTER — Ambulatory Visit: Admitting: Cardiology
